# Patient Record
Sex: Female | Born: 1970 | Race: Black or African American | Hispanic: No | Marital: Single | Smoking: Current every day smoker
Health system: Southern US, Community
[De-identification: ages and names within clinical notes are randomized; demographics above are authoritative.]

## PROBLEM LIST (undated history)

## (undated) DIAGNOSIS — F419 Anxiety disorder, unspecified: Secondary | ICD-10-CM

## (undated) DIAGNOSIS — R609 Edema, unspecified: Secondary | ICD-10-CM

## (undated) DIAGNOSIS — M1990 Unspecified osteoarthritis, unspecified site: Secondary | ICD-10-CM

## (undated) DIAGNOSIS — K589 Irritable bowel syndrome without diarrhea: Secondary | ICD-10-CM

## (undated) DIAGNOSIS — J302 Other seasonal allergic rhinitis: Secondary | ICD-10-CM

## (undated) DIAGNOSIS — I10 Essential (primary) hypertension: Secondary | ICD-10-CM

## (undated) HISTORY — DX: Other seasonal allergic rhinitis: J30.2

## (undated) HISTORY — DX: Irritable bowel syndrome, unspecified: K58.9

## (undated) HISTORY — PX: TONSILLECTOMY: SUR1361

## (undated) HISTORY — PX: TONSILLECTOMY AND ADENOIDECTOMY: SHX28

## (undated) HISTORY — PX: TUBAL LIGATION: SHX77

## (undated) HISTORY — DX: Edema, unspecified: R60.9

---

## 2000-06-16 ENCOUNTER — Emergency Department (HOSPITAL_COMMUNITY): Admission: EM | Admit: 2000-06-16 | Discharge: 2000-06-17 | Payer: Self-pay | Admitting: Emergency Medicine

## 2001-03-13 ENCOUNTER — Other Ambulatory Visit: Admission: RE | Admit: 2001-03-13 | Discharge: 2001-03-13 | Payer: Self-pay | Admitting: *Deleted

## 2002-01-22 ENCOUNTER — Encounter: Payer: Self-pay | Admitting: Family Medicine

## 2002-01-22 ENCOUNTER — Encounter: Admission: RE | Admit: 2002-01-22 | Discharge: 2002-01-22 | Payer: Self-pay | Admitting: Family Medicine

## 2003-09-07 ENCOUNTER — Encounter: Admission: RE | Admit: 2003-09-07 | Discharge: 2003-09-07 | Payer: Self-pay | Admitting: Family Medicine

## 2003-09-07 ENCOUNTER — Other Ambulatory Visit: Admission: RE | Admit: 2003-09-07 | Discharge: 2003-09-07 | Payer: Self-pay | Admitting: Obstetrics and Gynecology

## 2004-09-11 ENCOUNTER — Other Ambulatory Visit: Admission: RE | Admit: 2004-09-11 | Discharge: 2004-09-11 | Payer: Self-pay | Admitting: Obstetrics and Gynecology

## 2004-09-12 ENCOUNTER — Other Ambulatory Visit: Admission: RE | Admit: 2004-09-12 | Discharge: 2004-09-12 | Payer: Self-pay | Admitting: Obstetrics and Gynecology

## 2006-08-12 ENCOUNTER — Encounter: Admission: RE | Admit: 2006-08-12 | Discharge: 2006-08-12 | Payer: Self-pay | Admitting: Obstetrics & Gynecology

## 2007-03-30 ENCOUNTER — Emergency Department (HOSPITAL_COMMUNITY): Admission: EM | Admit: 2007-03-30 | Discharge: 2007-03-30 | Payer: Self-pay | Admitting: Emergency Medicine

## 2007-11-03 ENCOUNTER — Emergency Department (HOSPITAL_COMMUNITY): Admission: EM | Admit: 2007-11-03 | Discharge: 2007-11-04 | Payer: Self-pay | Admitting: Emergency Medicine

## 2007-12-09 ENCOUNTER — Inpatient Hospital Stay (HOSPITAL_COMMUNITY): Admission: AD | Admit: 2007-12-09 | Discharge: 2007-12-10 | Payer: Self-pay | Admitting: Family Medicine

## 2008-08-05 ENCOUNTER — Inpatient Hospital Stay (HOSPITAL_COMMUNITY): Admission: AD | Admit: 2008-08-05 | Discharge: 2008-08-05 | Payer: Self-pay | Admitting: Obstetrics and Gynecology

## 2008-08-17 ENCOUNTER — Emergency Department (HOSPITAL_COMMUNITY): Admission: EM | Admit: 2008-08-17 | Discharge: 2008-08-18 | Payer: Self-pay | Admitting: Emergency Medicine

## 2008-10-09 ENCOUNTER — Emergency Department (HOSPITAL_COMMUNITY): Admission: EM | Admit: 2008-10-09 | Discharge: 2008-10-09 | Payer: Self-pay | Admitting: Family Medicine

## 2009-06-23 ENCOUNTER — Emergency Department (HOSPITAL_COMMUNITY): Admission: EM | Admit: 2009-06-23 | Discharge: 2009-06-23 | Payer: Self-pay | Admitting: Family Medicine

## 2011-05-14 LAB — URINALYSIS, ROUTINE W REFLEX MICROSCOPIC
Bilirubin Urine: NEGATIVE
Glucose, UA: NEGATIVE
Specific Gravity, Urine: 1.007
pH: 6

## 2011-05-14 LAB — URINE CULTURE

## 2011-05-14 LAB — URINE MICROSCOPIC-ADD ON

## 2011-05-15 LAB — WET PREP, GENITAL
Trich, Wet Prep: NONE SEEN
Yeast Wet Prep HPF POC: NONE SEEN

## 2011-05-25 LAB — WET PREP, GENITAL: Trich, Wet Prep: NONE SEEN

## 2011-05-25 LAB — GC/CHLAMYDIA PROBE AMP, GENITAL: GC Probe Amp, Genital: NEGATIVE

## 2011-05-25 LAB — HERPES SIMPLEX VIRUS CULTURE

## 2013-05-07 ENCOUNTER — Ambulatory Visit: Payer: Self-pay | Admitting: Gynecology

## 2013-05-14 ENCOUNTER — Other Ambulatory Visit (HOSPITAL_COMMUNITY)
Admission: RE | Admit: 2013-05-14 | Discharge: 2013-05-14 | Disposition: A | Discharge status: Home / Self Care | Payer: Managed Care, Other (non HMO) | Source: Ambulatory Visit | Attending: Gynecology | Admitting: Gynecology

## 2013-05-14 ENCOUNTER — Encounter: Payer: Self-pay | Admitting: Gynecology

## 2013-05-14 ENCOUNTER — Ambulatory Visit (INDEPENDENT_AMBULATORY_CARE_PROVIDER_SITE_OTHER): Payer: Managed Care, Other (non HMO) | Admitting: Gynecology

## 2013-05-14 VITALS — BP 134/86 | Ht 66.25 in | Wt 203.4 lb

## 2013-05-14 DIAGNOSIS — R87810 Cervical high risk human papillomavirus (HPV) DNA test positive: Secondary | ICD-10-CM | POA: Insufficient documentation

## 2013-05-14 DIAGNOSIS — R635 Abnormal weight gain: Secondary | ICD-10-CM

## 2013-05-14 DIAGNOSIS — Z1151 Encounter for screening for human papillomavirus (HPV): Secondary | ICD-10-CM | POA: Insufficient documentation

## 2013-05-14 DIAGNOSIS — Z01419 Encounter for gynecological examination (general) (routine) without abnormal findings: Secondary | ICD-10-CM

## 2013-05-14 DIAGNOSIS — N898 Other specified noninflammatory disorders of vagina: Secondary | ICD-10-CM

## 2013-05-14 DIAGNOSIS — Z113 Encounter for screening for infections with a predominantly sexual mode of transmission: Secondary | ICD-10-CM

## 2013-05-14 DIAGNOSIS — F17200 Nicotine dependence, unspecified, uncomplicated: Secondary | ICD-10-CM

## 2013-05-14 LAB — CBC WITH DIFFERENTIAL/PLATELET
Basophils Relative: 1 % (ref 0–1)
HCT: 42.7 % (ref 36.0–46.0)
Hemoglobin: 15.3 g/dL — ABNORMAL HIGH (ref 12.0–15.0)
Lymphs Abs: 1.4 10*3/uL (ref 0.7–4.0)
MCHC: 35.8 g/dL (ref 30.0–36.0)
Monocytes Absolute: 0.5 10*3/uL (ref 0.1–1.0)
Monocytes Relative: 9 % (ref 3–12)
Neutro Abs: 3.6 10*3/uL (ref 1.7–7.7)
RBC: 4.5 MIL/uL (ref 3.87–5.11)

## 2013-05-14 LAB — COMPREHENSIVE METABOLIC PANEL
Albumin: 4.2 g/dL (ref 3.5–5.2)
BUN: 9 mg/dL (ref 6–23)
CO2: 28 mEq/L (ref 19–32)
Calcium: 9.6 mg/dL (ref 8.4–10.5)
Chloride: 102 mEq/L (ref 96–112)
Glucose, Bld: 95 mg/dL (ref 70–99)
Potassium: 3.3 mEq/L — ABNORMAL LOW (ref 3.5–5.3)
Sodium: 136 mEq/L (ref 135–145)
Total Protein: 7 g/dL (ref 6.0–8.3)

## 2013-05-14 LAB — CHOLESTEROL, TOTAL: Cholesterol: 191 mg/dL (ref 0–200)

## 2013-05-14 LAB — HEMOGLOBIN A1C: Mean Plasma Glucose: 103 mg/dL (ref <117)

## 2013-05-14 LAB — WET PREP FOR TRICH, YEAST, CLUE
Trich, Wet Prep: NONE SEEN
Yeast Wet Prep HPF POC: NONE SEEN

## 2013-05-14 MED ORDER — TINIDAZOLE 500 MG PO TABS: ORAL_TABLET | ORAL | Status: DC

## 2013-05-14 NOTE — Patient Instructions (Addendum)
Smoking Cessation Quitting smoking is important to your health and has many advantages. However, it is not always easy to quit since nicotine is a very addictive drug. Often times, people try 3 times or more before being able to quit. This document explains the best ways for you to prepare to quit smoking. Quitting takes hard work and a lot of effort, but you can do it. ADVANTAGES OF QUITTING SMOKING  You will live longer, feel better, and live better.  Your body will feel the impact of quitting smoking almost immediately.  Within 20 minutes, blood pressure decreases. Your pulse returns to its normal level.  After 8 hours, carbon monoxide levels in the blood return to normal. Your oxygen level increases.  After 24 hours, the chance of having a heart attack starts to decrease. Your breath, hair, and body stop smelling like smoke.  After 48 hours, damaged nerve endings begin to recover. Your sense of taste and smell improve.  After 72 hours, the body is virtually free of nicotine. Your bronchial tubes relax and breathing becomes easier.  After 2 to 12 weeks, lungs can hold more air. Exercise becomes easier and circulation improves.  The risk of having a heart attack, stroke, cancer, or lung disease is greatly reduced.  After 1 year, the risk of coronary heart disease is cut in half.  After 5 years, the risk of stroke falls to the same as a nonsmoker.  After 10 years, the risk of lung cancer is cut in half and the risk of other cancers decreases significantly.  After 15 years, the risk of coronary heart disease drops, usually to the level of a nonsmoker.  If you are pregnant, quitting smoking will improve your chances of having a healthy baby.  The people you live with, especially any children, will be healthier.  You will have extra money to spend on things other than cigarettes. QUESTIONS TO THINK ABOUT BEFORE ATTEMPTING TO QUIT You may want to talk about your answers with your  caregiver.  Why do you want to quit?  If you tried to quit in the past, what helped and what did not?  What will be the most difficult situations for you after you quit? How will you plan to handle them?  Who can help you through the tough times? Your family? Friends? A caregiver?  What pleasures do you get from smoking? What ways can you still get pleasure if you quit? Here are some questions to ask your caregiver:  How can you help me to be successful at quitting?  What medicine do you think would be best for me and how should I take it?  What should I do if I need more help?  What is smoking withdrawal like? How can I get information on withdrawal? GET READY  Set a quit date.  Change your environment by getting rid of all cigarettes, ashtrays, matches, and lighters in your home, car, or work. Do not let people smoke in your home.  Review your past attempts to quit. Think about what worked and what did not. GET SUPPORT AND ENCOURAGEMENT You have a better chance of being successful if you have help. You can get support in many ways.  Tell your family, friends, and co-workers that you are going to quit and need their support. Ask them not to smoke around you.  Get individual, group, or telephone counseling and support. Programs are available at local hospitals and health centers. Call your local health department for   information about programs in your area.  Spiritual beliefs and practices may help some smokers quit.  Download a "quit meter" on your computer to keep track of quit statistics, such as how long you have gone without smoking, cigarettes not smoked, and money saved.  Get a self-help book about quitting smoking and staying off of tobacco. LEARN NEW SKILLS AND BEHAVIORS  Distract yourself from urges to smoke. Talk to someone, go for a walk, or occupy your time with a task.  Change your normal routine. Take a different route to work. Drink tea instead of coffee.  Eat breakfast in a different place.  Reduce your stress. Take a hot bath, exercise, or read a book.  Plan something enjoyable to do every day. Reward yourself for not smoking.  Explore interactive web-based programs that specialize in helping you quit. GET MEDICINE AND USE IT CORRECTLY Medicines can help you stop smoking and decrease the urge to smoke. Combining medicine with the above behavioral methods and support can greatly increase your chances of successfully quitting smoking.  Nicotine replacement therapy helps deliver nicotine to your body without the negative effects and risks of smoking. Nicotine replacement therapy includes nicotine gum, lozenges, inhalers, nasal sprays, and skin patches. Some may be available over-the-counter and others require a prescription.  Antidepressant medicine helps people abstain from smoking, but how this works is unknown. This medicine is available by prescription.  Nicotinic receptor partial agonist medicine simulates the effect of nicotine in your brain. This medicine is available by prescription. Ask your caregiver for advice about which medicines to use and how to use them based on your health history. Your caregiver will tell you what side effects to look out for if you choose to be on a medicine or therapy. Carefully read the information on the package. Do not use any other product containing nicotine while using a nicotine replacement product.  RELAPSE OR DIFFICULT SITUATIONS Most relapses occur within the first 3 months after quitting. Do not be discouraged if you start smoking again. Remember, most people try several times before finally quitting. You may have symptoms of withdrawal because your body is used to nicotine. You may crave cigarettes, be irritable, feel very hungry, cough often, get headaches, or have difficulty concentrating. The withdrawal symptoms are only temporary. They are strongest when you first quit, but they will go away within  10 14 days. To reduce the chances of relapse, try to:  Avoid drinking alcohol. Drinking lowers your chances of successfully quitting.  Reduce the amount of caffeine you consume. Once you quit smoking, the amount of caffeine in your body increases and can give you symptoms, such as a rapid heartbeat, sweating, and anxiety.  Avoid smokers because they can make you want to smoke.  Do not let weight gain distract you. Many smokers will gain weight when they quit, usually less than 10 pounds. Eat a healthy diet and stay active. You can always lose the weight gained after you quit.  Find ways to improve your mood other than smoking. FOR MORE INFORMATION  www.smokefree.gov  Document Released: 07/31/2001 Document Revised: 02/05/2012 Document Reviewed: 11/15/2011 [facility] Patient Information 2014 [place], [place].  Tetanus, Diphtheria, Pertussis (Tdap) Vaccine What You Need to Know WHY GET VACCINATED? Tetanus, diphtheria and pertussis can be very serious diseases, even for adolescents and adults. Tdap vaccine can protect [place] from these diseases. TETANUS (Lockjaw) causes painful muscle tightening and stiffness, usually all over the body.  It can lead to tightening of muscles in  the head and neck so you can't open your mouth, swallow, or sometimes even breathe. Tetanus kills about 1 out of 5 people who are infected. DIPHTHERIA can cause a thick coating to form in the back of the throat.  It can lead to breathing problems, paralysis, heart failure, and death. PERTUSSIS (Whooping Cough) causes severe coughing spells, which can cause difficulty breathing, vomiting and disturbed sleep.  It can also lead to weight loss, incontinence, and rib fractures. Up to 2 in 100 adolescents and 5 in 100 adults with pertussis are hospitalized or have complications, which could include pneumonia and death. These diseases are caused by bacteria. Diphtheria and pertussis are spread from person to person through coughing  or sneezing. Tetanus enters the body through cuts, scratches, or wounds. Before vaccines, the [place] saw as many as 200,000 cases a year of diphtheria and pertussis, and hundreds of cases of tetanus. Since vaccination began, tetanus and diphtheria have dropped by about 99% and pertussis by about 80%. TDAP VACCINE Tdap vaccine can protect adolescents and adults from tetanus, diphtheria, and pertussis. One dose of Tdap is routinely given at age 70 or 10. People who did not get Tdap at that age should get it as soon as possible. Tdap is especially important for health care professionals and anyone having close contact with a baby younger than 12 months. Pregnant women should get a dose of Tdap during every pregnancy, to protect the newborn from pertussis. Infants are most at risk for severe, life-threatening complications from pertussis. A similar vaccine, called Td, protects from tetanus and diphtheria, but not pertussis. A Td booster should be given every 10 years. Tdap may be given as one of these boosters if you have not already gotten a dose. Tdap may also be given after a severe cut or burn to prevent tetanus infection. Your doctor can give you more information. Tdap may safely be given at the same time as other vaccines. SOME PEOPLE SHOULD NOT GET THIS VACCINE  If you ever had a life-threatening allergic reaction after a dose of any tetanus, diphtheria, or pertussis containing vaccine, OR if you have a severe allergy to any part of this vaccine, you should not get Tdap. Tell your doctor if you have any severe allergies.  If you had a coma, or long or multiple seizures within 7 days after a childhood dose of DTP or DTaP, you should not get Tdap, unless a cause other than the vaccine was found. You can still get Td.  Talk to your doctor if you:  have epilepsy or another nervous system problem,  had severe pain or swelling after any vaccine containing diphtheria, tetanus or  pertussis,  ever had Guillain-Barr Syndrome (GBS),  aren't feeling well on the day the shot is scheduled. RISKS OF A VACCINE REACTION With any medicine, including vaccines, there is a chance of side effects. These are usually mild and go away on their own, but serious reactions are also possible. Brief fainting spells can follow a vaccination, leading to injuries from falling. Sitting or lying down for about 15 minutes can help prevent these. Tell your doctor if you feel dizzy or light-headed, or have vision changes or ringing in the ears. Mild problems following Tdap (Did not interfere with activities)  Pain where the shot was given (about 3 in 4 adolescents or 2 in 3 adults)  Redness or swelling where the shot was given (about 1 person in 5)  Mild fever of at least 100.30F (up  to about 1 in 25 adolescents or 1 in 100 adults)  Headache (about 3 or 4 people in 10)  Tiredness (about 1 person in 3 or 4)  Nausea, vomiting, diarrhea, stomach ache (up to 1 in 4 adolescents or 1 in 10 adults)  Chills, body aches, sore joints, rash, swollen glands (uncommon) Moderate problems following Tdap (Interfered with activities, but did not require medical attention)  Pain where the shot was given (about 1 in 5 adolescents or 1 in 100 adults)  Redness or swelling where the shot was given (up to about 1 in 16 adolescents or 1 in 25 adults)  Fever over 102F (about 1 in 100 adolescents or 1 in 250 adults)  Headache (about 3 in 20 adolescents or 1 in 10 adults)  Nausea, vomiting, diarrhea, stomach ache (up to 1 or 3 people in 100)  Swelling of the entire arm where the shot was given (up to about 3 in 100). Severe problems following Tdap (Unable to perform usual activities, required medical attention)  Swelling, severe pain, bleeding and redness in the arm where the shot was given (rare). A severe allergic reaction could occur after any vaccine (estimated less than 1 in a million doses). WHAT IF  THERE IS A SERIOUS REACTION? What should I look for?  Look for anything that concerns you, such as signs of a severe allergic reaction, very high fever, or behavior changes. Signs of a severe allergic reaction can include hives, swelling of the face and throat, difficulty breathing, a fast heartbeat, dizziness, and weakness. These would start a few minutes to a few hours after the vaccination. What should I do?  If you think it is a severe allergic reaction or other emergency that can't wait, call 9-1-1 or get the person to the nearest hospital. Otherwise, call your doctor.  Afterward, the reaction should be reported to the "Vaccine Adverse Event Reporting System" (VAERS). Your doctor might file this report, or you can do it yourself through the VAERS web site at www.vaers.LAgents.no, or by calling 1-419-194-6243. VAERS is only for reporting reactions. They do not give medical advice.  THE NATIONAL VACCINE INJURY COMPENSATION PROGRAM The National Vaccine Injury Compensation Program (VICP) is a federal program that was created to compensate people who may have been injured by certain vaccines. Persons who believe they may have been injured by a vaccine can learn about the program and about filing a claim by calling 1-(418) 749-1733 or visiting the VICP website at SpiritualWord.at. HOW CAN I LEARN MORE?  Ask your doctor.  Call your local or state health department.  Contact the Centers for Disease Control and Prevention (CDC):  Call 952-118-8864 or visit CDC's website at PicCapture.uy. CDC Tdap Vaccine VIS (12/27/11) Document Released: 02/05/2012 Document Revised: 04/30/2012 Document Reviewed: 02/05/2012 [employer] Patient Information 2014 [place], [place].  Varenicline oral tablets What is this medicine? VARENICLINE (var EN i kleen) is used to help people quit smoking. It can reduce the symptoms caused by stopping smoking. It is used with a patient support program  recommended by your physician. This medicine may be used for other purposes; ask your health care provider or pharmacist if you have questions. What should I tell my health care provider before I take this medicine? They need to know if you have any of these conditions: -bipolar disorder, depression, schizophrenia or other mental illness -heart disease -kidney disease -peripheral vascular disease -stroke -suicidal thoughts, plans, or attempt; a previous suicide attempt by you or a family member -an unusual  or allergic reaction to varenicline, other medicines, foods, dyes, or preservatives -pregnant or trying to get pregnant -breast-feeding How should I use this medicine? You should set a date to stop smoking and tell your doctor. Start this medicine one week before the quit date. You can also start taking this medicine before you choose a quit date, and then pick a quit date that is between 8 and 35 days of treatment with this medicine. Stick to your plan; ask about support groups or other ways to help you remain a 'quitter'. Take this medicine by mouth after eating. Take with a full glass of water. Follow the directions on the prescription label. Take your doses at regular intervals. Do not take your medicine more often than directed. A special MedGuide will be given to you by the pharmacist with each prescription and refill. Be sure to read this information carefully each time. Talk to your pediatrician regarding the use of this medicine in children. This medicine is not approved for use in children. Overdosage: If you think you have taken too much of this medicine contact a poison control center or emergency room at once. NOTE: This medicine is only for you. Do not share this medicine with others. What if I miss a dose? If you miss a dose, take it as soon as you can. If it is almost time for your next dose, take only that dose. Do not take double or extra doses. What may interact with this  medicine? -insulin -other stop smoking aids -theophylline -warfarin This list may not describe all possible interactions. Give your health care provider a list of all the medicines, herbs, non-prescription drugs, or dietary supplements you use. Also tell them if you smoke, drink alcohol, or use illegal drugs. Some items may interact with your medicine. What should I watch for while using this medicine? Visit your doctor or health care professional for regular check ups. Ask for ongoing advice and encouragement from your doctor or healthcare professional, friends, and family to help you quit. If you smoke while on this medication, quit again Your mouth may get dry. Chewing sugarless gum or sucking hard candy, and drinking plenty of water may help. Contact your doctor if the problem does not go away or is severe. You may get drowsy or dizzy. Do not drive, use machinery, or do anything that needs mental alertness until you know how this medicine affects you. Do not stand or sit up quickly, especially if you are an older patient. This reduces the risk of dizzy or fainting spells. The use of this medicine may increase the chance of suicidal thoughts or actions. Pay special attention to how you are responding while on this medicine. Any worsening of mood, or thoughts of suicide or dying should be reported to your health care professional right away. What side effects may I notice from receiving this medicine? Side effects that you should report to your doctor or health care professional as soon as possible: -allergic reactions like skin rash, itching or hives, swelling of the face, lips, tongue, or throat -breathing problems -changes in vision -chest pain or chest tightness -confusion, trouble speaking or understanding -fast, irregular heartbeat -feeling faint or lightheaded, falls -fever -pain in legs when walking -problems with balance, talking, walking -ringing in ears -sudden numbness or  weakness of the face, arm or leg -suicidal thoughts or other mood changes -trouble passing urine or change in the amount of urine -unusual bleeding or bruising -unusually weak or tired Side  effects that usually do not require medical attention (report to your doctor or health care professional if they continue or are bothersome): -constipation -headache -nausea, vomiting -strange dreams -stomach gas -trouble sleeping This list may not describe all possible side effects. Call your doctor for medical advice about side effects. You may report side effects to FDA at 1-800-FDA-1088. Where should I keep my medicine? Keep out of the reach of children. Store at room temperature between 15 and 30 degrees C (59 and 86 degrees F). Throw away any unused medicine after the expiration date. NOTE: This sheet is a summary. It may not cover all possible information. If you have questions about this medicine, talk to your doctor, pharmacist, or health care provider.  2013, Elsevier/Gold Standard. (03/13/2010 3:12:38 PM)   Bacterial Vaginosis Bacterial vaginosis (BV) is a vaginal infection where the normal balance of bacteria in the vagina is disrupted. The normal balance is then replaced by an overgrowth of certain bacteria. There are several different kinds of bacteria that can cause BV. BV is the most common vaginal infection in women of childbearing age. CAUSES   The cause of BV is not fully understood. BV develops when there is an increase or imbalance of harmful bacteria.  Some activities or behaviors can upset the normal balance of bacteria in the vagina and put women at increased risk including:  Having a new sex partner or multiple sex partners.  Douching.  Using an intrauterine device (IUD) for contraception.  It is not clear what role sexual activity plays in the development of BV. However, women that have never had sexual intercourse are rarely infected with BV. Women do not get BV from  toilet seats, bedding, swimming pools or from touching objects around them.  SYMPTOMS   Grey vaginal discharge.  A fish-like odor with discharge, especially after sexual intercourse.  Itching or burning of the vagina and vulva.  Burning or pain with urination.  Some women have no signs or symptoms at all. DIAGNOSIS  Your caregiver must examine the vagina for signs of BV. Your caregiver will perform lab tests and look at the sample of vaginal fluid through a microscope. They will look for bacteria and abnormal cells (clue cells), a pH test higher than 4.5, and a positive amine test all associated with BV.  RISKS AND COMPLICATIONS   Pelvic inflammatory disease (PID).  Infections following gynecology surgery.  Developing HIV.  Developing herpes virus. TREATMENT  Sometimes BV will clear up without treatment. However, all women with symptoms of BV should be treated to avoid complications, especially if gynecology surgery is planned. Female partners generally do not need to be treated. However, BV may spread between female sex partners so treatment is helpful in preventing a recurrence of BV.   BV may be treated with antibiotics. The antibiotics come in either pill or vaginal cream forms. Either can be used with nonpregnant or pregnant women, but the recommended dosages differ. These antibiotics are not harmful to the baby.  BV can recur after treatment. If this happens, a second round of antibiotics will often be prescribed.  Treatment is important for pregnant women. If not treated, BV can cause a premature delivery, especially for a pregnant woman who had a premature birth in the past. All pregnant women who have symptoms of BV should be checked and treated.  For chronic reoccurrence of BV, treatment with a type of prescribed gel vaginally twice a week is helpful. HOME CARE INSTRUCTIONS   Finish all medication  as directed by your caregiver.  Do not have sex until treatment is  completed.  Tell your sexual partner that you have a vaginal infection. They should see their caregiver and be treated if they have problems, such as a mild rash or itching.  Practice safe sex. Use condoms. Only have 1 sex partner. PREVENTION  Basic prevention steps can help reduce the risk of upsetting the natural balance of bacteria in the vagina and developing BV:  Do not have sexual intercourse (be abstinent).  Do not douche.  Use all of the medicine prescribed for treatment of BV, even if the signs and symptoms go away.  Tell your sex partner if you have BV. That way, they can be treated, if needed, to prevent reoccurrence. SEEK MEDICAL CARE IF:   Your symptoms are not improving after 3 days of treatment.  You have increased discharge, pain, or fever. MAKE SURE YOU:   Understand these instructions.  Will watch your condition.  Will get help right away if you are not doing well or get worse. FOR MORE INFORMATION  Division of STD Prevention (DSTDP), Centers for Disease Control and Prevention: SolutionApps.co.za American Social Health Association (ASHA): www.ashastd.org  Document Released: 08/06/2005 Document Revised: 10/29/2011 Document Reviewed: 01/27/2009 [facility] Patient Information 2014 [place], [place].

## 2013-05-14 NOTE — Addendum Note (Signed)
Addended by: Bertram Savin A on: 05/14/2013 10:24 AM   Modules accepted: Orders

## 2013-05-14 NOTE — Progress Notes (Signed)
Ashley Bailey December 15, 1970 [id number]   History:    42 y.o.  for annual gyn exam New patient to the practice. Patient stated her gynecological examination in another medical facility was over 2 years ago. Patient denies any prior history of abnormal Pap smears. Patient has been complaining of several weeks of vaginal discharge slightly clear but no pruritus. Patient has not been sexually active in over 7 months. Patient reports normal menstrual cycles. Patient is a gravida 3 para 2 (2 cesarean sections/BTSP) Ab1 (elective termination). The patient smokes half a pack of cigarettes per day. Patient with strong family history of diabetes were as her sister has type 1 diabetes. Patient also had been complaining of weight gain. Patient declined flu vaccine. Patient uncertain of Tdap vaccine status. Last mammogram 2007 (dense).  Past medical history,surgical history, family history and social history were all reviewed and documented in the EPIC chart.  Gynecologic History Patient's last menstrual period was 05/04/2013. Contraception: tubal ligation Last Pap: 2007. Results were: normal Last mammogram: 2007. Results were: normal  Obstetric History OB History  Gravida Para Term Preterm AB SAB TAB Ectopic Multiple Living  3 2   1     2     # Outcome Date GA Lbr Len/2nd Weight Sex Delivery Anes PTL Lv  3 ABT           2 PAR           1 PAR                ROS: A ROS was performed and pertinent positives and negatives are included in the history.  GENERAL: No fevers or chills. HEENT: No change in vision, no earache, sore throat or sinus congestion. NECK: No pain or stiffness. CARDIOVASCULAR: No chest pain or pressure. No palpitations. PULMONARY: No shortness of breath, cough or wheeze. GASTROINTESTINAL: No abdominal pain, nausea, vomiting or diarrhea, melena or bright red blood per rectum. GENITOURINARY: No urinary frequency, urgency, hesitancy or dysuria. MUSCULOSKELETAL: No joint or muscle pain, no  back pain, no recent trauma. DERMATOLOGIC: No rash, no itching, no lesions. ENDOCRINE: No polyuria, polydipsia, no heat or cold intolerance. No recent change in weight. HEMATOLOGICAL: No anemia or easy bruising or bleeding. NEUROLOGIC: No headache, seizures, numbness, tingling or weakness. PSYCHIATRIC: No depression, no loss of interest in normal activity or change in sleep pattern.    Complaining of clear discharge with fascia odor  Exam: chaperone present  BP 134/86  Ht 5' 6.25" (1.683 m)  Wt 203 lb 6.4 oz (92.262 kg)  BMI 32.57 kg/m2  LMP 05/04/2013  Body mass index is 32.57 kg/(m^2).  General appearance : Well developed well nourished female. No acute distress HEENT: Neck supple, trachea midline, no carotid bruits, no thyroidmegaly Lungs: Clear to auscultation, no rhonchi or wheezes, or rib retractions  Heart: Regular rate and rhythm, no murmurs or gallops Breast:Examined in sitting and supine position were symmetrical in appearance, no palpable masses or tenderness,  no skin retraction, no nipple inversion, no nipple discharge, no skin discoloration, no axillary or supraclavicular lymphadenopathy Abdomen: no palpable masses or tenderness, no rebound or guarding Extremities: no edema or skin discoloration or tenderness  Pelvic:  Bartholin, Urethra, Skene Glands: Within normal limits             Vagina: No gross lesions or discharge, clear white discharge fascia where  Cervix: No gross lesions or discharge  Uterus  anteverted, normal size, shape and consistency, non-tender and mobile  Adnexa  Without masses  or tenderness  Anus and perineum  normal   Rectovaginal  normal sphincter tone without palpated masses or tenderness             Hemoccult none indicated   Wet prep: Pos amine, moderate clue cells, few white blood cell, too numerous to count bacteria  Assessment/Plan:  42 y.o. female for annual exam with clinical evidence of bacterial vaginosis. Patient will be started on  Tindamax 2 g today and repeat 2 g in 24 hours.. GC and chlamydia culture pending. Patient was counseled on the detrimental effects of smoking. Literature and information was provided as well as on Chantix. Patient declined flu vaccine. She will check with her primary physician if she has received her Tdap vaccine. Pap smear done today. Lab test done today as follows: CBC, screen cholesterol, TSH, hemoglobin A1c, comprehensive metabolic panel, and urinalysis. Patient was reminded to do her monthly self breast examination. We discussed the utilization of probiotic gel to minimize recurrence of BV or yeast infections. Patient was provided with requisition to schedule her mammogram which was overdue.    Ok Edwards MD, 10:16 AM 05/14/2013

## 2013-05-15 ENCOUNTER — Other Ambulatory Visit: Payer: Self-pay | Admitting: *Deleted

## 2013-05-15 DIAGNOSIS — E876 Hypokalemia: Secondary | ICD-10-CM

## 2013-05-15 LAB — URINALYSIS W MICROSCOPIC + REFLEX CULTURE
Bacteria, UA: NONE SEEN
Casts: NONE SEEN
Hgb urine dipstick: NEGATIVE
Ketones, ur: NEGATIVE mg/dL
Leukocytes, UA: NEGATIVE
Nitrite: NEGATIVE
Protein, ur: NEGATIVE mg/dL

## 2013-05-15 LAB — GC/CHLAMYDIA PROBE AMP
CT Probe RNA: NEGATIVE
GC Probe RNA: NEGATIVE

## 2013-05-15 LAB — TSH: TSH: 1.635 u[IU]/mL (ref 0.350–4.500)

## 2013-05-28 ENCOUNTER — Other Ambulatory Visit: Payer: Managed Care, Other (non HMO)

## 2013-06-24 ENCOUNTER — Telehealth: Payer: Self-pay | Admitting: *Deleted

## 2013-06-24 MED ORDER — TINIDAZOLE 500 MG PO TABS: ORAL_TABLET | ORAL | Status: DC

## 2013-06-24 NOTE — Telephone Encounter (Signed)
Sent rx to pharmacy, left the below on pt voicemail.

## 2013-06-24 NOTE — Telephone Encounter (Signed)
Please call and prescription refill if symptoms continue she needs to come to the office for culture

## 2013-06-24 NOTE — Telephone Encounter (Signed)
Pt calling requesting refill on Tindamax given on 05/14/13, pt said symptoms have returned clear discharge. OV declined for now. Please advise

## 2013-06-25 ENCOUNTER — Other Ambulatory Visit: Payer: Self-pay

## 2013-11-27 ENCOUNTER — Other Ambulatory Visit: Payer: Self-pay

## 2013-11-27 ENCOUNTER — Telehealth: Payer: Self-pay | Admitting: *Deleted

## 2013-11-27 DIAGNOSIS — Z1231 Encounter for screening mammogram for malignant neoplasm of breast: Secondary | ICD-10-CM

## 2013-11-27 NOTE — Telephone Encounter (Signed)
Pt called requesting name of mammogram facility, name & # of breast center given to patient.

## 2013-11-30 ENCOUNTER — Ambulatory Visit: Payer: Managed Care, Other (non HMO)

## 2013-12-07 ENCOUNTER — Ambulatory Visit: Payer: Managed Care, Other (non HMO)

## 2013-12-07 ENCOUNTER — Ambulatory Visit
Admission: RE | Admit: 2013-12-07 | Discharge: 2013-12-07 | Disposition: A | Discharge status: Home / Self Care | Payer: Managed Care, Other (non HMO) | Source: Ambulatory Visit

## 2013-12-07 DIAGNOSIS — Z1231 Encounter for screening mammogram for malignant neoplasm of breast: Secondary | ICD-10-CM

## 2013-12-11 ENCOUNTER — Ambulatory Visit: Payer: Managed Care, Other (non HMO)

## 2014-06-10 ENCOUNTER — Other Ambulatory Visit: Payer: Self-pay

## 2014-06-10 ENCOUNTER — Ambulatory Visit
Admission: RE | Admit: 2014-06-10 | Discharge: 2014-06-10 | Disposition: A | Discharge status: Home / Self Care | Payer: 59 | Source: Ambulatory Visit

## 2014-06-10 ENCOUNTER — Ambulatory Visit: Admission: RE | Admit: 2014-06-10 | Payer: 59 | Source: Ambulatory Visit

## 2014-06-10 DIAGNOSIS — Z1231 Encounter for screening mammogram for malignant neoplasm of breast: Secondary | ICD-10-CM

## 2014-06-21 ENCOUNTER — Encounter: Payer: Self-pay | Admitting: Gynecology

## 2014-07-06 ENCOUNTER — Ambulatory Visit (INDEPENDENT_AMBULATORY_CARE_PROVIDER_SITE_OTHER): Payer: Managed Care, Other (non HMO) | Admitting: Women's Health

## 2014-07-06 ENCOUNTER — Encounter: Payer: Self-pay | Admitting: Women's Health

## 2014-07-06 VITALS — BP 140/80 | Ht 66.0 in | Wt 204.0 lb

## 2014-07-06 DIAGNOSIS — L700 Acne vulgaris: Secondary | ICD-10-CM

## 2014-07-06 DIAGNOSIS — B9689 Other specified bacterial agents as the cause of diseases classified elsewhere: Secondary | ICD-10-CM

## 2014-07-06 DIAGNOSIS — A499 Bacterial infection, unspecified: Secondary | ICD-10-CM

## 2014-07-06 DIAGNOSIS — N898 Other specified noninflammatory disorders of vagina: Secondary | ICD-10-CM

## 2014-07-06 DIAGNOSIS — N760 Acute vaginitis: Secondary | ICD-10-CM

## 2014-07-06 LAB — WET PREP FOR TRICH, YEAST, CLUE
Trich, Wet Prep: NONE SEEN
WBC, Wet Prep HPF POC: NONE SEEN
YEAST WET PREP: NONE SEEN

## 2014-07-06 MED ORDER — METRONIDAZOLE 0.75 % VA GEL: VAGINAL | Status: DC

## 2014-07-06 MED ORDER — TINIDAZOLE 500 MG PO TABS: ORAL_TABLET | ORAL | Status: DC

## 2014-07-06 MED ORDER — DOXYCYCLINE HYCLATE 100 MG PO CAPS: ORAL_CAPSULE | ORAL | Status: DC

## 2014-07-06 NOTE — Progress Notes (Signed)
Patient ID: Ashley Bailey, female   DOB: [birth year], 43 y.o.   MRN: [id number] Presents with complaint of vaginal discharge with odor. Has had problems with recurrent BV no relief with  boric acid. Reports best relief with Tindamax. Same partner, monthly cycle/BTL. Denies urinary symptoms, abdominal pain or fever.  Exam: Appears well. External genitalia within normal limits, speculum exam scant brown discharge and menses, scant odor, wet prep positive for amines, clues, TNTC bacteria. Bimanual no CMT or adnexal tenderness.  Bacteria vaginosis  Plan: Tindamax 2 g by mouth 1 dose, MetroGel one half to one applicator after cycle each monthfor prevention. Instructed to call if no relief of symptoms. Alcohol precautions reviewed. Also requested refill of doxycycline 100 takes twice daily as needed for rectal skin boils, does have follow-up with primary carewho prescribes. Keep scheduled annual exam next month with Dr. Toney Rakes. Marland Kitchen

## 2014-07-06 NOTE — Patient Instructions (Signed)
Bacterial Vaginosis Bacterial vaginosis is an infection of the vagina. It happens when too many of certain germs (bacteria) grow in the vagina. HOME CARE  Take your medicine as told by your doctor.  Finish your medicine even if you start to feel better.  Do not have sex until you finish your medicine and are better.  Tell your sex partner that you have an infection. They should see their doctor for treatment.  Practice safe sex. Use condoms. Have only one sex partner. GET HELP IF:  You are not getting better after 3 days of treatment.  You have more grey fluid (discharge) coming from your vagina than before.  You have more pain than before.  You have a fever. MAKE SURE YOU:   Understand these instructions.  Will watch your condition.  Will get help right away if you are not doing well or get worse. Document Released: 05/15/2008 Document Revised: 05/27/2013 Document Reviewed: 03/18/2013 Exit[employer] Patient Information 2015 [employer]. This information is not intended to replace advice given to you by your health care provider. Make sure you discuss any questions you have with your health care provider.  

## 2014-07-07 LAB — URINALYSIS W MICROSCOPIC + REFLEX CULTURE
Bacteria, UA: NONE SEEN
Bilirubin Urine: NEGATIVE
CASTS: NONE SEEN
Crystals: NONE SEEN
GLUCOSE, UA: NEGATIVE mg/dL
HGB URINE DIPSTICK: NEGATIVE
Ketones, ur: NEGATIVE mg/dL
LEUKOCYTES UA: NEGATIVE
Nitrite: NEGATIVE
PH: 6 (ref 5.0–8.0)
Protein, ur: NEGATIVE mg/dL
Specific Gravity, Urine: 1.005 (ref 1.005–1.030)
Squamous Epithelial / LPF: NONE SEEN
Urobilinogen, UA: 0.2 mg/dL (ref 0.0–1.0)

## 2014-08-02 ENCOUNTER — Encounter: Payer: Managed Care, Other (non HMO) | Admitting: Gynecology

## 2014-08-06 ENCOUNTER — Encounter: Payer: Managed Care, Other (non HMO) | Admitting: Gynecology

## 2014-09-16 ENCOUNTER — Encounter: Payer: Managed Care, Other (non HMO) | Admitting: Gynecology

## 2014-12-24 ENCOUNTER — Telehealth: Payer: Self-pay | Admitting: *Deleted

## 2014-12-24 NOTE — Telephone Encounter (Signed)
Pt called c/o yeast infection requesting diflucan tablet, last seen in office for problem visit was 06/2014. Last annual is 04/2013. I advised pt OV needed for Rx. Pt said she will call back.

## 2015-10-21 ENCOUNTER — Encounter: Payer: Self-pay | Admitting: Gastroenterology

## 2015-11-09 ENCOUNTER — Other Ambulatory Visit: Payer: Self-pay

## 2015-11-09 ENCOUNTER — Ambulatory Visit (INDEPENDENT_AMBULATORY_CARE_PROVIDER_SITE_OTHER): Payer: BLUE CROSS/BLUE SHIELD | Admitting: Gastroenterology

## 2015-11-09 ENCOUNTER — Encounter: Payer: Self-pay | Admitting: Gastroenterology

## 2015-11-09 VITALS — BP 154/89 | HR 88 | Temp 98.5°F | Ht 66.0 in | Wt 213.4 lb

## 2015-11-09 DIAGNOSIS — R1013 Epigastric pain: Secondary | ICD-10-CM | POA: Diagnosis not present

## 2015-11-09 DIAGNOSIS — Z1211 Encounter for screening for malignant neoplasm of colon: Secondary | ICD-10-CM | POA: Diagnosis not present

## 2015-11-09 DIAGNOSIS — K219 Gastro-esophageal reflux disease without esophagitis: Secondary | ICD-10-CM

## 2015-11-09 MED ORDER — PEG-KCL-NACL-NASULF-NA ASC-C 100 G PO SOLR: 1.0000 | Freq: Once | ORAL | Status: DC

## 2015-11-09 MED ORDER — PANTOPRAZOLE SODIUM 40 MG PO TBEC: 40.0000 mg | DELAYED_RELEASE_TABLET | Freq: Every day | ORAL | Status: DC

## 2015-11-09 NOTE — Patient Instructions (Signed)
Start taking Protonix once each morning, 30 minutes before breakfast.  I would like for you to try a probiotic once a day for a week (Align, Restora, [place]). I have provided samples. Then, you may try IBgard for one week. Let us know which one you like best.  We have scheduled you for a colonoscopy and possible upper endoscopy with Dr. Oneida Alar in the near future!

## 2015-11-09 NOTE — Progress Notes (Signed)
Primary Care Physician:  Leamon Arnt, MD Primary Gastroenterologist:  Dr. Oneida Alar   Chief Complaint  Patient presents with  . Irritable Bowel Syndrome    HPI:   Ashley Bailey is a 45 y.o. female presenting today at the request of her PCP secondary to IBS. She is here to establish care. She notes a history of constipation-predominant symptoms that are actually controlled well with diet and non-prescriptive measures. She drinks water, hot coffee, and then has a BM every day. Has had scant hematochezia at times. No prior colonoscopy. Notes occasional rectal discomfort. Her main concern is loud rumbling in her stomach but NO pain. Notes bloating. Has had some indigestion, belching, feels like food comes up for the past 2-3 months but no esophageal dysphagia. Sits somewhat heavy in her upper abdomen. Feels full at times. No weight loss or lack of appetite. Sometimes mild nausea. No PPI. Has taken BC powders in past but none currently. Gasx and beano without help for loud stomach rumbles.   Past Medical History  Diagnosis Date  . IBS (irritable bowel syndrome)   . Seasonal allergies   . Edema     Past Surgical History  Procedure Laterality Date  . Cesarean section      X2  . Tonsillectomy and adenoidectomy      Current Outpatient Prescriptions  Medication Sig Dispense Refill  . clonazePAM (KLONOPIN) 0.5 MG tablet Take by mouth.    . hydrochlorothiazide (HYDRODIURIL) 25 MG tablet Take 25 mg by mouth daily.    . hydrOXYzine (VISTARIL) 25 MG capsule      No current facility-administered medications for this visit.    Allergies as of 11/09/2015  . (No Known Allergies)    Family History  Problem Relation Age of Onset  . Hypertension Mother   . Diabetes Sister   . Colon cancer Neg Hx     Social History   Social History  . Marital Status: Divorced    Spouse Name: N/A  . Number of Children: N/A  . Years of Education: N/A   Occupational History  . Not on file.    Social History Main Topics  . Smoking status: Current Every Day Smoker -- 0.50 packs/day    Types: Cigarettes  . Smokeless tobacco: Never Used  . Alcohol Use: 0.0 oz/week    0 Standard drinks or equivalent per week     Comment: occasionally on weekends   . Drug Use: No  . Sexual Activity: No   Other Topics Concern  . Not on file   Social History Narrative    Review of Systems: As mentioned in HPI    Physical Exam: BP 154/89 mmHg  Pulse 88  Temp(Src) 98.5 F (36.9 C) (Oral)  Ht 5\' 6"  (1.676 m)  Wt 213 lb 6.4 oz (96.798 kg)  BMI 34.46 kg/m2  LMP 10/19/2015 General:   Alert and oriented. Pleasant and cooperative. Well-nourished and well-developed.  Head:  Normocephalic and atraumatic. Eyes:  Without icterus, sclera clear and conjunctiva pink.  Ears:  Normal auditory acuity. Nose:  No deformity, discharge,  or lesions. Mouth:  No deformity or lesions, oral mucosa pink.  Lungs:  Clear to auscultation bilaterally. No wheezes, rales, or rhonchi. No distress.  Heart:  S1, S2 present without murmurs appreciated.  Abdomen:  +BS, soft, non-tender and non-distended. No HSM noted. No guarding or rebound. No masses appreciated.  Rectal:  Deferred  Msk:  Symmetrical without gross deformities. Normal posture. Extremities:  Without clubbing or edema.  Neurologic:  Alert and  oriented x4;  grossly normal neurologically. Skin:  Intact without significant lesions or rashes. Psych:  Alert and cooperative. Normal mood and affect.

## 2015-11-09 NOTE — Assessment & Plan Note (Signed)
Occasional reflux, early satiety, belching, and mild nausea. No PPI currently. Likely GERD-related symptoms, gastritis. Remote use of BC powders but none currently. Will trial Protonix once daily. If no improvement in upper GI symptoms by time of colonoscopy, will pursue EGD. Risks and benefits discussed in detail with stated understanding.

## 2015-11-09 NOTE — Assessment & Plan Note (Signed)
45 year old female with a reported history of presumed IBS, constipation predominant, presenting with need to establish care. She is doing well with dietary and behavior modification, only noting loud "rumblings" in abdomen but denies pain. Intermittent low-volume hematochezia likely benign source. Will pursue screening colonoscopy now.   Proceed with colonoscopy with Dr. Oneida Alar in the near future. The risks, benefits, and alternatives have been discussed in detail with the patient. They state understanding and desire to proceed.  Phenergan 25 mg IV on call Trial probiotic once daily, samples of IBgard also provided to trial

## 2015-11-10 NOTE — Progress Notes (Signed)
CC'ED TO PCP 

## 2015-11-21 ENCOUNTER — Telehealth: Payer: Self-pay | Admitting: Gastroenterology

## 2015-11-21 MED ORDER — RESTORA PO CAPS: 1.0000 | ORAL_CAPSULE | Freq: Every day | ORAL | Status: DC

## 2015-11-21 NOTE — Telephone Encounter (Signed)
Forwarding to the refill box.  

## 2015-11-21 NOTE — Telephone Encounter (Signed)
PLEASE CALL PATIENT REGARDING RESTORA PRESCRIPTION, PHARMACY TOLD HER THAT IF SHE HAD A SCRIPT SHE COULD GET CHEAPER

## 2015-11-21 NOTE — Addendum Note (Signed)
Addended by: Orvil Feil on: 11/21/2015 12:22 PM   Modules accepted: Orders

## 2015-11-21 NOTE — Telephone Encounter (Signed)
I sent in Odin.

## 2015-11-21 NOTE — Telephone Encounter (Signed)
PT is aware.

## 2015-12-06 ENCOUNTER — Encounter (HOSPITAL_COMMUNITY)
Admission: RE | Disposition: A | Discharge status: Home / Self Care | Payer: Self-pay | Source: Ambulatory Visit | Attending: Gastroenterology

## 2015-12-06 ENCOUNTER — Encounter (HOSPITAL_COMMUNITY): Payer: Self-pay

## 2015-12-06 ENCOUNTER — Ambulatory Visit (HOSPITAL_COMMUNITY)
Admission: RE | Admit: 2015-12-06 | Discharge: 2015-12-06 | Disposition: A | Discharge status: Home / Self Care | Payer: BLUE CROSS/BLUE SHIELD | Source: Ambulatory Visit | Attending: Gastroenterology | Admitting: Gastroenterology

## 2015-12-06 DIAGNOSIS — K2970 Gastritis, unspecified, without bleeding: Secondary | ICD-10-CM | POA: Diagnosis not present

## 2015-12-06 DIAGNOSIS — K648 Other hemorrhoids: Secondary | ICD-10-CM | POA: Insufficient documentation

## 2015-12-06 DIAGNOSIS — F17210 Nicotine dependence, cigarettes, uncomplicated: Secondary | ICD-10-CM | POA: Insufficient documentation

## 2015-12-06 DIAGNOSIS — Z1211 Encounter for screening for malignant neoplasm of colon: Secondary | ICD-10-CM | POA: Diagnosis not present

## 2015-12-06 DIAGNOSIS — R1013 Epigastric pain: Secondary | ICD-10-CM | POA: Diagnosis present

## 2015-12-06 DIAGNOSIS — K219 Gastro-esophageal reflux disease without esophagitis: Secondary | ICD-10-CM | POA: Insufficient documentation

## 2015-12-06 HISTORY — PX: COLONOSCOPY: SHX5424

## 2015-12-06 HISTORY — PX: ESOPHAGOGASTRODUODENOSCOPY: SHX5428

## 2015-12-06 SURGERY — COLONOSCOPY
Anesthesia: Moderate Sedation

## 2015-12-06 MED ORDER — STERILE WATER FOR IRRIGATION IR SOLN
Status: DC | PRN
  Administered 2015-12-06: 12:00:00

## 2015-12-06 MED ORDER — SODIUM CHLORIDE 0.9 % IV SOLN
INTRAVENOUS | Status: DC
  Administered 2015-12-06: 12:00:00 via INTRAVENOUS

## 2015-12-06 MED ORDER — MINERAL OIL PO OIL
TOPICAL_OIL | ORAL | Status: AC
  Filled 2015-12-06: qty 30

## 2015-12-06 MED ORDER — MIDAZOLAM HCL 5 MG/5ML IJ SOLN
INTRAMUSCULAR | Status: DC | PRN
  Administered 2015-12-06 (×2): 2 mg via INTRAVENOUS
  Administered 2015-12-06: 1 mg via INTRAVENOUS
  Administered 2015-12-06: 2 mg via INTRAVENOUS

## 2015-12-06 MED ORDER — MEPERIDINE HCL 100 MG/ML IJ SOLN
INTRAMUSCULAR | Status: AC
  Filled 2015-12-06: qty 2

## 2015-12-06 MED ORDER — LIDOCAINE VISCOUS 2 % MT SOLN
OROMUCOSAL | Status: AC
  Filled 2015-12-06: qty 15

## 2015-12-06 MED ORDER — MIDAZOLAM HCL 5 MG/5ML IJ SOLN
INTRAMUSCULAR | Status: AC
  Filled 2015-12-06: qty 10

## 2015-12-06 MED ORDER — MEPERIDINE HCL 100 MG/ML IJ SOLN
INTRAMUSCULAR | Status: DC | PRN
Start: 2015-12-06 — End: 2015-12-06
  Administered 2015-12-06 (×2): 25 mg via INTRAVENOUS
  Administered 2015-12-06: 50 mg via INTRAVENOUS
  Administered 2015-12-06: 25 mg via INTRAVENOUS

## 2015-12-06 MED ORDER — PROMETHAZINE HCL 25 MG/ML IJ SOLN
25.0000 mg | Freq: Once | INTRAMUSCULAR | Status: AC
  Administered 2015-12-06: 25 mg via INTRAVENOUS

## 2015-12-06 NOTE — Progress Notes (Signed)
REVIEWED-NO ADDITIONAL RECOMMENDATIONS. 

## 2015-12-06 NOTE — H&P (Addendum)
  Primary Care Physician:  Leamon Arnt, MD Primary Gastroenterologist:  Dr. Oneida Alar  Pre-Procedure History & Physical: HPI:  Ashley Bailey is a 45 y.o. female here for screening/dyspepsia.  Past Medical History  Diagnosis Date  . IBS (irritable bowel syndrome)   . Seasonal allergies   . Edema     Past Surgical History  Procedure Laterality Date  . Cesarean section      X2  . Tonsillectomy and adenoidectomy      Prior to Admission medications   Medication Sig Start Date End Date Taking? Authorizing Provider  clonazePAM (KLONOPIN) 0.5 MG tablet Take by mouth. 10/13/15 12/12/15  Historical Provider, MD  hydrochlorothiazide (HYDRODIURIL) 25 MG tablet Take 25 mg by mouth daily.    Historical Provider, MD  hydrOXYzine (VISTARIL) 25 MG capsule  11/18/14   Historical Provider, MD  pantoprazole (PROTONIX) 40 MG tablet Take 1 tablet (40 mg total) by mouth daily. 11/09/15   Orvil Feil, NP  peg 3350 powder (MOVIPREP) 100 g SOLR Take 1 kit (200 g total) by mouth once. 11/09/15 12/09/15  Orvil Feil, NP  Probiotic Product (RESTORA) CAPS Take 1 capsule by mouth daily. 11/21/15   Orvil Feil, NP    Allergies as of 11/09/2015  . (No Known Allergies)    Family History  Problem Relation Age of Onset  . Hypertension Mother   . Diabetes Sister   . Colon cancer Neg Hx     Social History   Social History  . Marital Status: Divorced    Spouse Name: N/A  . Number of Children: N/A  . Years of Education: N/A   Occupational History  . Not on file.   Social History Main Topics  . Smoking status: Current Every Day Smoker -- 0.50 packs/day    Types: Cigarettes  . Smokeless tobacco: Never Used  . Alcohol Use: 0.0 oz/week    0 Standard drinks or equivalent per week     Comment: occasionally on weekends   . Drug Use: No  . Sexual Activity: No   Other Topics Concern  . Not on file   Social History Narrative    Review of Systems: See HPI, otherwise negative ROS   Physical  Exam: LMP 10/19/2015 General:   Alert,  pleasant and cooperative in NAD Head:  Normocephalic and atraumatic. Neck:  Supple; Lungs:  Clear throughout to auscultation.    Heart:  Regular rate and rhythm. Abdomen:  Soft, nontender and nondistended. Normal bowel sounds, without guarding, and without rebound.   Neurologic:  Alert and  oriented x4;  grossly normal neurologically.  Impression/Plan:    screening/DYSPEPSIA  PLAN: EGD/TCS TODAY

## 2015-12-06 NOTE — Discharge Instructions (Addendum)
YOU have GASTRITIS. YOU DID NOT HAVE ANY POLYPS. You have internal hemorrhoids. I biopsied your stomach.   DRINK WATER TO KEEP YOUR URINE LIGHT YELLOW.  CONTINUE YOUR WEIGHT LOSS EFFORTS. LOSE 10 POUNDS.  FOLLOW A HIGH FIBER/low fat DIET. AVOID ITEMS THAT CAUSE BLOATING. SEE INFO BELOW ON A HIGH FIBER/low fat DIET.  TAKE A PROBIOTIC DAILY FOR three MONTHs ([facility], [place]).    TAKE PROTONIX 30 MINUTES PRIOR TO first MEAL DAILY.  AVOID ITEMS THAT TRIGGER GASTRITIS. SEE INFO BELOW.  YOUR BIOPSY RESULTS WILL BE AVAILABLE IN MY CHART APR 21 AND MY OFFICE WILL CONTACT YOU IN 10-14 DAYS WITH YOUR RESULTS.   FOLLOW UP IN 4 MOS.     ENDOSCOPY Care After Read the instructions outlined below and refer to this sheet in the next week. These discharge instructions provide you with general information on caring for yourself after you leave the hospital. While your treatment has been planned according to the most current medical practices available, unavoidable complications occasionally occur. If you have any problems or questions after discharge, call DR. Loghan Subia, [phone].  ACTIVITY  You may resume your regular activity, but move at a slower pace for the next 24 hours.   Take frequent rest periods for the next 24 hours.   Walking will help get rid of the air and reduce the bloated feeling in your belly (abdomen).   No driving for 24 hours (because of the medicine (anesthesia) used during the test).   You may shower.   Do not sign any important legal documents or operate any machinery for 24 hours (because of the anesthesia used during the test).    NUTRITION  Drink plenty of fluids.   You may resume your normal diet as instructed by your doctor.   Begin with a light meal and progress to your normal diet. Heavy or fried foods are harder to digest and may make you feel sick to your stomach (nauseated).   Avoid alcoholic beverages for 24 hours or as  instructed.    MEDICATIONS  You may resume your normal medications.   WHAT YOU CAN EXPECT TODAY  Some feelings of bloating in the abdomen.   Passage of more gas than usual.   Spotting of blood in your stool or on the toilet paper  .  IF YOU HAD POLYPS REMOVED DURING THE ENDOSCOPY:  Eat a soft diet IF YOU HAVE NAUSEA, BLOATING, ABDOMINAL PAIN, OR VOMITING.    FINDING OUT THE RESULTS OF YOUR TEST Not all test results are available during your visit. DR. Oneida Alar WILL CALL YOU WITHIN 14 DAYS OF YOUR PROCEDUE WITH YOUR RESULTS. Do not assume everything is normal if you have not heard from DR. Rosan Calbert, CALL HER OFFICE AT [phone].  SEEK IMMEDIATE MEDICAL ATTENTION AND CALL THE OFFICE: [phone] IF:  You have more than a spotting of blood in your stool.   Your belly is swollen (abdominal distention).   You are nauseated or vomiting.   You have a temperature over 101F.   You have abdominal pain or discomfort that is severe or gets worse throughout the day.  High-Fiber Diet A high-fiber diet changes your normal diet to include more whole grains, legumes, fruits, and vegetables. Changes in the diet involve replacing refined carbohydrates with unrefined foods. The calorie level of the diet is essentially unchanged. The Dietary Reference Intake (recommended amount) for adult males is 38 grams per day. For adult females, it is 25 grams per  day. Pregnant and lactating women should consume 28 grams of fiber per day. Fiber is the intact part of a plant that is not broken down during digestion. Functional fiber is fiber that has been isolated from the plant to provide a beneficial effect in the body. PURPOSE  Increase stool bulk.   Ease and regulate bowel movements.   Lower cholesterol.  REDUCE RISK OF COLON CANCER  INDICATIONS THAT YOU NEED MORE FIBER  Constipation and hemorrhoids.   Uncomplicated diverticulosis (intestine condition) and irritable bowel syndrome.   Weight  management.   As a protective measure against hardening of the arteries (atherosclerosis), diabetes, and cancer.   GUIDELINES FOR INCREASING FIBER IN THE DIET  Start adding fiber to the diet slowly. A gradual increase of about 5 more grams (2 slices of whole-wheat bread, 2 servings of most fruits or vegetables, or 1 bowl of high-fiber cereal) per day is best. Too rapid an increase in fiber may result in constipation, flatulence, and bloating.   Drink enough water and fluids to keep your urine clear or pale yellow. Water, juice, or caffeine-free drinks are recommended. Not drinking enough fluid may cause constipation.   Eat a variety of high-fiber foods rather than one type of fiber.   Try to increase your intake of fiber through using high-fiber foods rather than fiber pills or supplements that contain small amounts of fiber.   The goal is to change the types of food eaten. Do not supplement your present diet with high-fiber foods, but replace foods in your present diet.   INCLUDE A VARIETY OF FIBER SOURCES  Replace refined and processed grains with whole grains, canned fruits with fresh fruits, and incorporate other fiber sources. White rice, white breads, and most bakery goods contain little or no fiber.   Brown whole-grain rice, buckwheat oats, and many fruits and vegetables are all good sources of fiber. These include: broccoli, Brussels sprouts, cabbage, cauliflower, beets, sweet potatoes, white potatoes (skin on), carrots, tomatoes, eggplant, squash, berries, fresh fruits, and dried fruits.   Cereals appear to be the richest source of fiber. Cereal fiber is found in whole grains and bran. Bran is the fiber-rich outer coat of cereal grain, which is largely removed in refining. In whole-grain cereals, the bran remains. In breakfast cereals, the largest amount of fiber is found in those with "bran" in their names. The fiber content is sometimes indicated on the label.   You may need to  include additional fruits and vegetables each day.   In baking, for 1 cup white flour, you may use the following substitutions:   1 cup whole-wheat flour minus 2 tablespoons.   1/2 cup white flour plus 1/2 cup whole-wheat flour.   Low-Fat Diet BREADS, CEREALS, PASTA, RICE, DRIED PEAS, AND BEANS These products are high in carbohydrates and most are low in fat. Therefore, they can be increased in the diet as substitutes for fatty foods. They too, however, contain calories and should not be eaten in excess. Cereals can be eaten for snacks as well as for breakfast.   FRUITS AND VEGETABLES It is good to eat fruits and vegetables. Besides being sources of fiber, both are rich in vitamins and some minerals. They help you get the daily allowances of these nutrients. Fruits and vegetables can be used for snacks and desserts.  MEATS Limit lean meat, chicken, [place], and fish to no more than 6 ounces per day. Beef, Pork, and Lamb Use lean cuts of beef, pork, and lamb. [employer]  cuts include:  Extra-lean ground beef.  Arm roast.  Sirloin tip.  Center-cut ham.  Round steak.  Loin chops.  Rump roast.  Tenderloin.  Trim all fat off the outside of meats before cooking. It is not necessary to severely decrease the intake of red meat, but lean choices should be made. Lean meat is rich in protein and contains a highly absorbable form of iron. Premenopausal women, in particular, should avoid reducing lean red meat because this could increase the risk for low red blood cells (iron-deficiency anemia).  Chicken and [place] These are good sources of protein. The fat of poultry can be reduced by removing the skin and underlying fat layers before cooking. Chicken and [place] can be substituted for lean red meat in the diet. Poultry should not be fried or covered with high-fat sauces. Fish and Shellfish Fish is a good source of protein. Shellfish contain cholesterol, but they usually are low in saturated fatty acids.  The preparation of fish is important. Like chicken and [place], they should not be fried or covered with high-fat sauces. EGGS Egg whites contain no fat or cholesterol. They can be eaten often. Try 1 to 2 egg whites instead of whole eggs in recipes or use egg substitutes that do not contain yolk. MILK AND DAIRY PRODUCTS Use skim or 1% milk instead of 2% or whole milk. Decrease whole milk, natural, and processed cheeses. Use nonfat or low-fat (2%) cottage cheese or low-fat cheeses made from vegetable oils. Choose nonfat or low-fat (1 to 2%) yogurt. Experiment with evaporated skim milk in recipes that call for heavy cream. Substitute low-fat yogurt or low-fat cottage cheese for sour cream in dips and salad dressings. Have at least 2 servings of low-fat dairy products, such as 2 glasses of skim (or 1%) milk each day to help get your daily calcium intake. FATS AND OILS Reduce the total intake of fats, especially saturated fat. Butterfat, lard, and beef fats are high in saturated fat and cholesterol. These should be avoided as much as possible. Vegetable fats do not contain cholesterol, but certain vegetable fats, such as coconut oil, palm oil, and palm kernel oil are very high in saturated fats. These should be limited. These fats are often used in bakery goods, processed foods, popcorn, oils, and nondairy creamers. Vegetable shortenings and some peanut butters contain hydrogenated oils, which are also saturated fats. Read the labels on these foods and check for saturated vegetable oils. Unsaturated vegetable oils and fats do not raise blood cholesterol. However, they should be limited because they are fats and are high in calories. Total fat should still be limited to 30% of your daily caloric intake. Desirable liquid vegetable oils are corn oil, cottonseed oil, olive oil, canola oil, safflower oil, soybean oil, and sunflower oil. Peanut oil is not as good, but small amounts are acceptable. Buy a heart-healthy  tub margarine that has no partially hydrogenated oils in the ingredients. Mayonnaise and salad dressings often are made from unsaturated fats, but they should also be limited because of their high calorie and fat content. Seeds, nuts, peanut butter, olives, and avocados are high in fat, but the fat is mainly the unsaturated type. These foods should be limited mainly to avoid excess calories and fat. OTHER EATING TIPS Snacks  Most sweets should be limited as snacks. They tend to be rich in calories and fats, and their caloric content outweighs their nutritional value. Some good choices in snacks are graham crackers, melba toast, soda crackers, bagels (  no egg), English muffins, fruits, and vegetables. These snacks are preferable to snack crackers, [place] fries, TORTILLA CHIPS, and POTATO chips. Popcorn should be air-popped or cooked in small amounts of liquid vegetable oil. Desserts Eat fruit, low-fat yogurt, and fruit ices instead of pastries, cake, and cookies. Sherbet, angel food cake, gelatin dessert, frozen low-fat yogurt, or other frozen products that do not contain saturated fat (pure fruit juice bars, frozen ice pops) are also acceptable.  COOKING METHODS Choose those methods that use little or no fat. They include: Poaching.  Braising.  Steaming.  Grilling.  Baking.  Stir-frying.  Broiling.  Microwaving.  Foods can be cooked in a nonstick pan without added fat, or use a nonfat cooking spray in regular cookware. Limit fried foods and avoid frying in saturated fat. Add moisture to lean meats by using water, broth, cooking wines, and other nonfat or low-fat sauces along with the cooking methods mentioned above. Soups and stews should be chilled after cooking. The fat that forms on top after a few hours in the refrigerator should be skimmed off. When preparing meals, avoid using excess salt. Salt can contribute to raising blood pressure in some people.  EATING AWAY FROM HOME Order entres,  potatoes, and vegetables without sauces or butter. When meat exceeds the size of a deck of cards (3 to 4 ounces), the rest can be taken home for another meal. Choose vegetable or fruit salads and ask for low-calorie salad dressings to be served on the side. Use dressings sparingly. Limit high-fat toppings, such as bacon, crumbled eggs, cheese, sunflower seeds, and olives. Ask for heart-healthy tub margarine instead of butter.    Gastritis  Gastritis is an inflammation (the body's way of reacting to injury and/or infection) of the stomach. It can also be caused BY ASPIRIN, BC/GOODY POWDER'S, (IBUPROFEN) MOTRIN, OR ALEVE (NAPROXEN), chemicals (including alcohol), SPICY FOODS, and medications. This illness may be associated with generalized malaise (feeling tired, not well), UPPER ABDOMINAL STOMACH cramps, and fever. One common bacterial cause of gastritis is an organism known as H. Pylori. This can be treated with antibiotics.      Hemorrhoids Hemorrhoids are dilated (enlarged) veins around the rectum. Sometimes clots will form in the veins. This makes them swollen and painful. These are called thrombosed hemorrhoids. Causes of hemorrhoids include:  Constipation.   Straining to have a bowel movement.   HEAVY LIFTING  HOME CARE INSTRUCTIONS  Eat a well balanced diet and drink 6 to 8 glasses of water every day to avoid constipation. You may also use a bulk laxative.   Avoid straining to have bowel movements.   Keep anal area dry and clean.   Do not use a donut shaped pillow or sit on the toilet for long periods. This increases blood pooling and pain.   Move your bowels when your body has the urge; this will require less straining and will decrease pain and pressure.

## 2015-12-08 NOTE — Op Note (Signed)
[facility] Patient Name: Ashley Bailey Procedure Date: 12/06/2015 12:34 PM MRN: [id number] Date of Birth: 26-Jul-1971 Attending MD: Barney Drain , MD CSN: [id number] Age: 45 Admit Type: Outpatient Procedure:                Upper GI endoscopy Indications:              Dyspepsia Providers:                Barney Drain, MD, Gwenlyn Fudge, RN, Randa Spike,                            Technician Referring MD:             Karie Fetch. Jonni Sanger Medicines:                Meperidine 25 mg IV, Midazolam 2 mg IV + TCS Complications:            No immediate complications. Estimated Blood Loss:     Estimated blood loss was minimal. Procedure:                Pre-Anesthesia Assessment:                           - Prior to the procedure, a History and Physical                            was performed, and patient medications and                            allergies were reviewed. The patient's tolerance of                            previous anesthesia was also reviewed. The risks                            and benefits of the procedure and the sedation                            options and risks were discussed with the patient.                            All questions were answered, and informed consent                            was obtained. Prior Anticoagulants: The patient has                            taken no previous anticoagulant or antiplatelet                            agents. ASA Grade Assessment: II - A patient with                            mild systemic disease. After reviewing the risks  and benefits, the patient was deemed in                            satisfactory condition to undergo the procedure.                           After obtaining informed consent, the endoscope was                            passed under direct vision. Throughout the                            procedure, the patient's blood pressure, pulse, and    oxygen saturations were monitored continuously. The                            [id number]) was introduced through the                            mouth, and advanced to the second part of duodenum.                            The upper GI endoscopy was accomplished without                            difficulty. The patient tolerated the procedure                            well. Scope In: 12:39:11 PM Scope Out: 12:43:00 PM Total Procedure Duration: 0 hours 3 minutes 49 seconds  Findings:      The examined esophagus was normal.      Mild inflammation characterized by congestion (edema) and erythema was       found in the gastric antrum. Biopsies were taken with a cold forceps for       Helicobacter pylori testing.      The duodenal bulb and second portion of the duodenum were normal. Impression:               - Normal esophagus.                           - Gastritis. Biopsied.                           - Normal duodenal bulb and second portion of the                            duodenum. Moderate Sedation:      Moderate (conscious) sedation was administered by the endoscopy nurse       and supervised by the endoscopist. The following parameters were       monitored: oxygen saturation, heart rate, blood pressure, and response       to care. Total physician intraservice time was 35 minutes. Recommendation:           - Patient has a contact number available for  emergencies. The signs and symptoms of potential                            delayed complications were discussed with the                            patient. Return to normal activities tomorrow.                            Written discharge instructions were provided to the                            patient.                           - High fiber diet and low fat diet.                           - Continue present medications.                           - Await pathology results.                            - Return to my office in 4 months. Procedure Code(s):        --- Professional ---                           [phone], Esophagogastroduodenoscopy, flexible,                            transoral; with biopsy, single or multiple                           99153, Moderate sedation services; each additional                            15 minutes intraservice time                           G0500, Moderate sedation services provided by the                            same physician or other qualified health care                            professional performing a gastrointestinal                            endoscopic service that sedation supports,                            requiring the presence of an independent trained                            observer to assist in the monitoring of the  patient's level of consciousness and physiological                            status; initial 15 minutes of intra-service time;                            patient age 30 years or older (additional time may                            be reported with [phone], as appropriate) Diagnosis Code(s):        --- Professional ---                           K29.70, Gastritis, unspecified, without bleeding                           R10.13, Epigastric pain CPT copyright 2016 American [employer]. All rights reserved. The codes documented in this report are preliminary and upon coder review may  be revised to meet current compliance requirements. Barney Drain, MD Barney Drain, MD 12/08/2015 1:45:50 PM This report has been signed electronically. Number of Addenda: 0

## 2015-12-08 NOTE — Op Note (Signed)
[facility] Patient Name: Ashley Bailey Procedure Date: 12/06/2015 11:54 AM MRN: [id number] Date of Birth: 06/11/1971 Attending MD: Barney Drain , MD CSN: [id number] Age: 45 Admit Type: Outpatient Procedure:                Colonoscopy Indications:              Screening for colorectal malignant neoplasm Providers:                Barney Drain, MD, Gwenlyn Fudge, RN, Randa Spike,                            Technician Referring MD:             Karie Fetch. Jonni Sanger Medicines:                Promethazine 25 mg IV, Meperidine 100 mg IV,                            Midazolam 5 mg IV Complications:            No immediate complications. Estimated blood loss:                            Minimal. Estimated Blood Loss:     Estimated blood loss was minimal. Procedure:                Pre-Anesthesia Assessment:                           - Prior to the procedure, a History and Physical                            was performed, and patient medications and                            allergies were reviewed. The patient's tolerance of                            previous anesthesia was also reviewed. The risks                            and benefits of the procedure and the sedation                            options and risks were discussed with the patient.                            All questions were answered, and informed consent                            was obtained. Prior Anticoagulants: The patient has                            taken no previous anticoagulant or antiplatelet  agents. ASA Grade Assessment: II - A patient with                            mild systemic disease. After reviewing the risks                            and benefits, the patient was deemed in                            satisfactory condition to undergo the procedure.                           After obtaining informed consent, the colonoscope                            was passed under  direct vision. Throughout the                            procedure, the patient's blood pressure, pulse, and                            oxygen saturations were monitored continuously. The                            EC-[id number] scope was introduced through                            the anus and advanced to the the cecum, identified                            by appendiceal orifice and ileocecal valve. The                            ileocecal valve, appendiceal orifice, and rectum                            were photographed. The colonoscopy was performed                            with ease. The patient tolerated the procedure                            well. The quality of the bowel preparation was                            excellent. Scope In: 12:23:02 PM Scope Out: 12:31:45 PM Scope Withdrawal Time: 0 hours 6 minutes 47 seconds  Total Procedure Duration: 0 hours 8 minutes 43 seconds  Findings:      The digital rectal exam findings include non-thrombosed external       hemorrhoids.      The colon (entire examined portion) appeared normal.      Non-bleeding internal hemorrhoids were found. Impression:               - Non-thrombosed external hemorrhoids  found on                            digital rectal exam.                           - The entire examined colon is normal.                           - Non-bleeding internal hemorrhoids.                           - No specimens collected. Moderate Sedation:      Moderate (conscious) sedation was administered by the endoscopy nurse       and supervised by the endoscopist. The following parameters were       monitored: oxygen saturation, heart rate, blood pressure, and response       to care. Total physician intraservice time was 35 minutes. Recommendation:           - Patient has a contact number available for                            emergencies. The signs and symptoms of potential                            delayed  complications were discussed with the                            patient. Return to normal activities tomorrow.                            Written discharge instructions were provided to the                            patient.                           - High fiber diet.                           - Continue present medications.                           - Await pathology results.                           - Repeat colonoscopy in 10 years for surveillance.                           - Return to my office in 4 months. Procedure Code(s):        --- Professional ---                           [id number], Colorectal cancer screening; colonoscopy on                            individual not meeting  criteria for high risk                           99153, Moderate sedation services; each additional                            15 minutes intraservice time                           G0500, Moderate sedation services provided by the                            same physician or other qualified health care                            professional performing a gastrointestinal                            endoscopic service that sedation supports,                            requiring the presence of an independent trained                            observer to assist in the monitoring of the                            patient's level of consciousness and physiological                            status; initial 15 minutes of intra-service time;                            patient age 67 years or older (additional time may                            be reported with [phone], as appropriate) Diagnosis Code(s):        --- Professional ---                           Z12.11, Encounter for screening for malignant                            neoplasm of colon                           K64.4, Residual hemorrhoidal skin tags                           K64.8, Other hemorrhoids CPT copyright 2016 American [employer] Association. All  rights reserved. The codes documented in this report are preliminary and upon coder review may  be revised to meet current compliance requirements. Barney Drain, MD Barney Drain, MD 12/08/2015 1:42:03 PM This report has been signed electronically. Number of Addenda: 0

## 2015-12-12 ENCOUNTER — Encounter (HOSPITAL_COMMUNITY): Payer: Self-pay | Admitting: Gastroenterology

## 2015-12-14 ENCOUNTER — Telehealth: Payer: Self-pay | Admitting: Gastroenterology

## 2015-12-14 ENCOUNTER — Encounter: Payer: Self-pay | Admitting: Gastroenterology

## 2015-12-14 NOTE — Telephone Encounter (Signed)
Reminder in epic °

## 2015-12-14 NOTE — Telephone Encounter (Addendum)
Please call pt. HER stomach Bx shows gastritis.   DRINK WATER TO KEEP YOUR URINE LIGHT YELLOW. CONTINUE YOUR WEIGHT LOSS EFFORTS. LOSE 10 POUNDS. FOLLOW A HIGH FIBER/low fat DIET. AVOID ITEMS THAT CAUSE BLOATING.  TAKE A PROBIOTIC DAILY FOR three MONTHs ([facility], [place]).  TAKE PROTONIX 30 MINUTES PRIOR TO first MEAL DAILY. AVOID ITEMS THAT TRIGGER GASTRITIS.   FOLLOW UP IN 4 MOS E30 DYSPEPSIA/GASTRITIS/GERD. NEXT TCS IN 10 YEARS.

## 2015-12-14 NOTE — Telephone Encounter (Signed)
Patient sent an email requesting path results. I informed her of the results and to continue Protonix once daily.

## 2015-12-14 NOTE — Telephone Encounter (Signed)
Noted  

## 2015-12-14 NOTE — Telephone Encounter (Signed)
LMOM to call.

## 2015-12-15 NOTE — Telephone Encounter (Signed)
Pt is aware of results and recommendations.  

## 2015-12-22 ENCOUNTER — Ambulatory Visit (INDEPENDENT_AMBULATORY_CARE_PROVIDER_SITE_OTHER): Payer: BLUE CROSS/BLUE SHIELD | Admitting: Gynecology

## 2015-12-22 ENCOUNTER — Other Ambulatory Visit: Payer: Self-pay | Admitting: Gynecology

## 2015-12-22 ENCOUNTER — Encounter: Payer: Self-pay | Admitting: Gynecology

## 2015-12-22 ENCOUNTER — Ambulatory Visit (INDEPENDENT_AMBULATORY_CARE_PROVIDER_SITE_OTHER): Payer: BLUE CROSS/BLUE SHIELD

## 2015-12-22 VITALS — BP 130/86

## 2015-12-22 DIAGNOSIS — N9489 Other specified conditions associated with female genital organs and menstrual cycle: Secondary | ICD-10-CM

## 2015-12-22 DIAGNOSIS — R102 Pelvic and perineal pain unspecified side: Secondary | ICD-10-CM

## 2015-12-22 DIAGNOSIS — N83201 Unspecified ovarian cyst, right side: Secondary | ICD-10-CM

## 2015-12-22 DIAGNOSIS — R110 Nausea: Secondary | ICD-10-CM

## 2015-12-22 DIAGNOSIS — N898 Other specified noninflammatory disorders of vagina: Secondary | ICD-10-CM | POA: Diagnosis not present

## 2015-12-22 DIAGNOSIS — N859 Noninflammatory disorder of uterus, unspecified: Secondary | ICD-10-CM

## 2015-12-22 DIAGNOSIS — D251 Intramural leiomyoma of uterus: Secondary | ICD-10-CM | POA: Diagnosis not present

## 2015-12-22 LAB — URINALYSIS W MICROSCOPIC + REFLEX CULTURE
BILIRUBIN URINE: NEGATIVE
CRYSTALS: NONE SEEN [HPF]
Casts: NONE SEEN [LPF]
Glucose, UA: NEGATIVE
HGB URINE DIPSTICK: NEGATIVE
KETONES UR: NEGATIVE
Nitrite: NEGATIVE
PROTEIN: NEGATIVE
Specific Gravity, Urine: <1.005 (ref 1.001–1.035)
Yeast: NONE SEEN [HPF]
pH: 5 (ref 5.0–8.0)

## 2015-12-22 LAB — WET PREP FOR TRICH, YEAST, CLUE
Clue Cells Wet Prep HPF POC: NONE SEEN
Trich, Wet Prep: NONE SEEN
Yeast Wet Prep HPF POC: NONE SEEN

## 2015-12-22 MED ORDER — MEDROXYPROGESTERONE ACETATE 150 MG/ML IM SUSP
150.0000 mg | Freq: Once | INTRAMUSCULAR | Status: AC
  Administered 2015-12-22: 150 mg via INTRAMUSCULAR

## 2015-12-22 NOTE — Patient Instructions (Signed)
Endometrial Biopsy Endometrial biopsy is a procedure in which a tissue sample is taken from inside the uterus. The tissue sample is then looked at under a microscope to see if the tissue is normal or abnormal. The endometrium is the lining of the uterus. This procedure helps determine where you are in your menstrual cycle and how hormone levels are affecting the lining of the uterus. This procedure may also be used to evaluate uterine bleeding or to diagnose endometrial cancer, tuberculosis, polyps, or inflammatory conditions.  LET YOUR HEALTH CARE PROVIDER KNOW ABOUT:  Any allergies you have.  All medicines you are taking, including vitamins, herbs, eye drops, creams, and over-the-counter medicines.  Previous problems you or members of your family have had with the use of anesthetics.  Any blood disorders you have.  Previous surgeries you have had.  Medical conditions you have.  Possibility of pregnancy. RISKS AND COMPLICATIONS Generally, this is a safe procedure. However, as with any procedure, complications can occur. Possible complications include:  Bleeding.  Pelvic infection.  Puncture of the uterine wall with the biopsy device (rare). BEFORE THE PROCEDURE   Keep a record of your menstrual cycles as directed by your health care provider. You may need to schedule your procedure for a specific time in your cycle.  You may want to bring a sanitary pad to wear home after the procedure.  Arrange for someone to drive you home after the procedure if you will be given a medicine to help you relax (sedative). PROCEDURE   You may be given a sedative to relax you.  You will lie on an exam table with your feet and legs supported as in a pelvic exam.  Your health care provider will insert an instrument (speculum) into your vagina to see your cervix.  Your cervix will be cleansed with an antiseptic solution. A medicine (local anesthetic) will be used to numb the cervix.  A forceps  instrument (tenaculum) will be used to hold your cervix steady for the biopsy.  A thin, rodlike instrument (uterine sound) will be inserted through your cervix to determine the length of your uterus and the location where the biopsy sample will be removed.  A thin, flexible tube (catheter) will be inserted through your cervix and into the uterus. The catheter is used to collect the biopsy sample from your endometrial tissue.  The catheter and speculum will then be removed, and the tissue sample will be sent to a lab for examination. AFTER THE PROCEDURE  You will rest in a recovery area until you are ready to go home.  You may have mild cramping and a small amount of vaginal bleeding for a few days after the procedure. This is normal.  Make sure you find out how to get your test results.   This information is not intended to replace advice given to you by your health care provider. Make sure you discuss any questions you have with your health care provider.   Document Released: 12/07/2004 Document Revised: 04/08/2013 Document Reviewed: 01/21/2013 Elsevier Interactive Patient Education 2016 Else[employer]. Endometrial Biopsy, Care After Refer to this sheet in the next few weeks. These instructions provide you with information on caring for yourself after your procedure. Your health care provider may also give you more specific instructions. Your treatment has been planned according to current medical practices, but problems sometimes occur. Call your health care provider if you have any problems or questions after your procedure. WHAT TO EXPECT AFTER THE PROCEDURE After   your procedure, it is typical to have the following:  You may have mild cramping and a small amount of vaginal bleeding for a few days after the procedure. This is normal. HOME CARE INSTRUCTIONS  Only take over-the-counter or prescription medicine as directed by your health care provider.  Do not douche, use tampons, or  have sexual intercourse until your health care provider approves.  Follow your health care provider's instructions regarding any activity restrictions, such as strenuous exercise or heavy lifting. SEEK MEDICAL CARE IF:  You have heavy bleeding or bleeding longer than 2 days after the procedure.  You have bad smelling drainage from your vagina.  You have a fever and chills.  Youhave severe lower stomach (abdominal) pain. SEEK IMMEDIATE MEDICAL CARE IF:  You have severe cramps in your stomach or back.  You pass large blood clots.  Your bleeding increases.  You become weak or lightheaded, or you pass out.   This information is not intended to replace advice given to you by your health care provider. Make sure you discuss any questions you have with your health care provider.   Document Released: 05/27/2013 Document Reviewed: 05/27/2013 Elsevier Interactive Patient Education 2016 [employer]. CA-125 Tumor Marker Test WHY AM I HAVING THIS TEST? This test is used to check the level of cancer antigen 125 (CA-125) in your blood. The CA-125 tumor marker test can be helpful in detecting ovarian cancer. The test is only performed if you are considered at high risk for ovarian cancer. Your health care provider may recommend this test if:  You have a strong family history of ovarian cancer.  You have a breast cancer antigen (BRCA) genetic defect. If you have already been diagnosed with ovarian cancer, your health care provider may use this test to help identify the extent of the disease and to monitor your response to treatment. WHAT KIND OF SAMPLE IS TAKEN? A blood sample is required for this test. It is usually collected by inserting a needle into a vein. HOW DO I PREPARE FOR THE TEST? There is no preparation required for this test. WHAT ARE THE REFERENCE RANGES? Reference ranges are considered healthy ranges established after testing a large group of healthy people. Reference ranges  may vary among different people, labs, and hospitals. It is your responsibility to obtain your test results. Ask the lab or department performing the test when and how you will get your results. The reference range for this test is 0-35 units/mL or less than 35 kunits/L (SI units). WHAT DO THE RESULTS MEAN? Increased levels of CA-125 may indicate:  Certain types of cancer, including:  Ovarian cancer.  Pancreatic cancer.  Colon cancer.  Lung cancer.  Breast cancer.  Lymphoma.  Noncancerous (benign) disorders, including:  Cirrhosis.  Pregnancy.  Endometriosis.  Pancreatitis.  Pelvic inflammatory disease (PID). Talk with your health care provider to discuss your results, treatment options, and if necessary, the need for more tests. Talk with your health care provider if you have any questions about your results.   This information is not intended to replace advice given to you by your health care provider. Make sure you discuss any questions you have with your health care provider.   Document Released: 08/28/2004 Document Revised: 08/27/2014 Document Reviewed: 12/24/2013 Elsevier Interactive Patient Education 2016 Elsevier Inc. Ovarian Cyst An ovarian cyst is a fluid-filled sac that forms on an ovary. The ovaries are small organs that produce eggs in women. Various types of cysts can form on the ovaries.  Most are not cancerous. Many do not cause problems, and they often go away on their own. Some may cause symptoms and require treatment. Common types of ovarian cysts include:  Functional cysts--These cysts may occur every month during the menstrual cycle. This is normal. The cysts usually go away with the next menstrual cycle if the woman does not get pregnant. Usually, there are no symptoms with a functional cyst.  Endometrioma cysts--These cysts form from the tissue that lines the uterus. They are also called "chocolate cysts" because they become filled with blood that turns  brown. This type of cyst can cause pain in the lower abdomen during intercourse and with your menstrual period.  Cystadenoma cysts--This type develops from the cells on the outside of the ovary. These cysts can get very big and cause lower abdomen pain and pain with intercourse. This type of cyst can twist on itself, cut off its blood supply, and cause severe pain. It can also easily rupture and cause a lot of pain.  Dermoid cysts--This type of cyst is sometimes found in both ovaries. These cysts may contain different kinds of body tissue, such as skin, teeth, hair, or cartilage. They usually do not cause symptoms unless they get very big.  Theca lutein cysts--These cysts occur when too much of a certain hormone (human chorionic gonadotropin) is produced and overstimulates the ovaries to produce an egg. This is most common after procedures used to assist with the conception of a baby (in vitro fertilization). CAUSES   Fertility drugs can cause a condition in which multiple large cysts are formed on the ovaries. This is called ovarian hyperstimulation syndrome.  A condition called polycystic ovary syndrome can cause hormonal imbalances that can lead to nonfunctional ovarian cysts. SIGNS AND SYMPTOMS  Many ovarian cysts do not cause symptoms. If symptoms are present, they may include:  Pelvic pain or pressure.  Pain in the lower abdomen.  Pain during sexual intercourse.  Increasing girth (swelling) of the abdomen.  Abnormal menstrual periods.  Increasing pain with menstrual periods.  Stopping having menstrual periods without being pregnant. DIAGNOSIS  These cysts are commonly found during a routine or annual pelvic exam. Tests may be ordered to find out more about the cyst. These tests may include:  Ultrasound.  X-ray of the pelvis.  CT scan.  MRI.  Blood tests. TREATMENT  Many ovarian cysts go away on their own without treatment. Your health care provider may want to check  your cyst regularly for 2-3 months to see if it changes. For women in menopause, it is particularly important to monitor a cyst closely because of the higher rate of ovarian cancer in menopausal women. When treatment is needed, it may include any of the following:  A procedure to drain the cyst (aspiration). This may be done using a long needle and ultrasound. It can also be done through a laparoscopic procedure. This involves using a thin, lighted tube with a tiny camera on the end (laparoscope) inserted through a small incision.  Surgery to remove the whole cyst. This may be done using laparoscopic surgery or an open surgery involving a larger incision in the lower abdomen.  Hormone treatment or birth control pills. These methods are sometimes used to help dissolve a cyst. HOME CARE INSTRUCTIONS   Only take over-the-counter or prescription medicines as directed by your health care provider.  Follow up with your health care provider as directed.  Get regular pelvic exams and Pap tests. SEEK MEDICAL CARE IF:  Your periods are late, irregular, or painful, or they stop.  Your pelvic pain or abdominal pain does not go away.  Your abdomen becomes larger or swollen.  You have pressure on your bladder or trouble emptying your bladder completely.  You have pain during sexual intercourse.  You have feelings of fullness, pressure, or discomfort in your stomach.  You lose weight for no apparent reason.  You feel generally ill.  You become constipated.  You lose your appetite.  You develop acne.  You have an increase in body and facial hair.  You are gaining weight, without changing your exercise and eating habits.  You think you are pregnant. SEEK IMMEDIATE MEDICAL CARE IF:   You have increasing abdominal pain.  You feel sick to your stomach (nauseous), and you throw up (vomit).  You develop a fever that comes on suddenly.  You have abdominal pain during a bowel  movement.  Your menstrual periods become heavier than usual. MAKE SURE YOU:  Understand these instructions.  Will watch your condition.  Will get help right away if you are not doing well or get worse.   This information is not intended to replace advice given to you by your health care provider. Make sure you discuss any questions you have with your health care provider.   Document Released: 08/06/2005 Document Revised: 08/11/2013 Document Reviewed: 04/13/2013 Elsevier Interactive Patient Education [employer].

## 2015-12-22 NOTE — Progress Notes (Signed)
   HPI: Is a 45 year old gravida 3 para 2 AB 1 presented to the office today with several month history of right lower quadrant pain on-and-off forceps the past week. She denied any fever, chills, vomiting but some nausea at least once a day. She has no change in appetite. No GU or GI complaints. She was complaining of vaginal discharge on and off for several months. She is not sexually active. She is reporting normal menstrual cycle. She is long overdue for gynecological exam. She did have a normal colonoscopy with the exception of hemorrhoids last here. Patient reports normal menstrual cycles.  ROS: A ROS was performed and pertinent positives and negatives are included in the history.  GENERAL: No fevers or chills. HEENT: No change in vision, no earache, sore throat or sinus congestion. NECK: No pain or stiffness. CARDIOVASCULAR: No chest pain or pressure. No palpitations. PULMONARY: No shortness of breath, cough or wheeze. GASTROINTESTINAL: Right lower abdominal pains, nausea without vomiting  no diarrhea, melena or bright red blood per rectum. GENITOURINARY: No urinary frequency, urgency, hesitancy or dysuria. Vaginal discharge MUSCULOSKELETAL: No joint or muscle pain, no back pain, no recent trauma. DERMATOLOGIC: No rash, no itching, no lesions. ENDOCRINE: No polyuria, polydipsia, no heat or cold intolerance. No recent change in weight. HEMATOLOGICAL: No anemia or easy bruising or bleeding. NEUROLOGIC: No headache, seizures, numbness, tingling or weakness. PSYCHIATRIC: No depression, no loss of interest in normal activity or change in sleep pattern.   PE: Blood pressure 130/86 Gen. appearance well-developed well-nourished female with the above-mentioned complaining Back: No CVA tenderness Abdomen: Soft nontender no rebound or guarding Pelvic: Bartholin urethra Skene was within normal limits Vagina: No lesions or discharge wet prep obtained Cervix: No lesions or discharge Uterus: Anteverted normal  size shape and consistency nontender Adnexa: No palpable masses or tenderness Rectal exam not done  Wet prep: Few white blood cell few bacteria are otherwise normal  Urinalysis: WBC 0-3, RBC 0-2, few bacteria essentially normal  Ultrasound: Uterus measured 9.7 x 6.1 x 4.8 cm with endometrial stripe of 10.9 mm left intramural fibroid 17 x 17 mm was noted and endometrial focus measuring 11 x 6 mm was noted endometrium try layer. A right ovarian thinwall cystic mass with internal low level echoes measuring 27 x 24 x 20 mm with positive color flow increased the periphery. Left ovary with microcalcification no fluid in the cul-de-sac  Patient was counseled for an endometrial biopsy. Cervix was cleansed with Betadine solution. A sterile Pipelle was introduced into the uterine cavity and moderate amount of tissue was obtained was given for histological evaluation. Of note several years ago patient had a tubal ligation.   Assessment Plan: Patient with several months history of right lower quadrant discomfort. Right ovarian cyst was seen. A CA 125 will be drawn today. Patient will receive Depo-Provera 150 mg IM today to help resolve the cyst. She'll return back in 3 months for follow-up ultrasound. Also patient incidentally was found to have an endometrial lesion possibly a polyp which may be contributing to her discharge and she experiences on and off at times and for this reason an endometrial biopsy was obtained and she'll return back to the office next week for sonohysterogram.    Greater than 50% of time was spent in counseling and coordinating care of this patient. Time of consultation:  25 Minutes.

## 2015-12-23 ENCOUNTER — Telehealth: Payer: Self-pay | Admitting: Gynecology

## 2015-12-23 LAB — CA 125: CA 125: 12 U/mL (ref <35)

## 2015-12-23 NOTE — Addendum Note (Signed)
Addended by: [name] on: 12/23/2015 02:24 PM   Modules accepted: Level of Service

## 2015-12-23 NOTE — Telephone Encounter (Signed)
12/23/15-I LM VM for pt to let her know that she has a 20% coins due for sonohysterogram as she has met her $500 deductible. I based the amount on the [phone] and the [phone] as JF already did the [phone] and [phone] on 12/22/15. Pt resp is $121.50. Per Samantha'@BC'$ [id number]

## 2015-12-24 LAB — URINE CULTURE
COLONY COUNT: NO GROWTH
Organism ID, Bacteria: NO GROWTH

## 2015-12-26 ENCOUNTER — Ambulatory Visit (INDEPENDENT_AMBULATORY_CARE_PROVIDER_SITE_OTHER): Payer: BLUE CROSS/BLUE SHIELD | Admitting: Gynecology

## 2015-12-26 ENCOUNTER — Ambulatory Visit: Payer: BLUE CROSS/BLUE SHIELD | Admitting: Gynecology

## 2015-12-26 ENCOUNTER — Encounter: Payer: Self-pay | Admitting: Gynecology

## 2015-12-26 ENCOUNTER — Ambulatory Visit (INDEPENDENT_AMBULATORY_CARE_PROVIDER_SITE_OTHER): Payer: BLUE CROSS/BLUE SHIELD

## 2015-12-26 ENCOUNTER — Other Ambulatory Visit: Payer: BLUE CROSS/BLUE SHIELD

## 2015-12-26 DIAGNOSIS — R102 Pelvic and perineal pain: Secondary | ICD-10-CM

## 2015-12-26 DIAGNOSIS — N840 Polyp of corpus uteri: Secondary | ICD-10-CM | POA: Insufficient documentation

## 2015-12-26 DIAGNOSIS — N83201 Unspecified ovarian cyst, right side: Secondary | ICD-10-CM | POA: Insufficient documentation

## 2015-12-26 DIAGNOSIS — N9489 Other specified conditions associated with female genital organs and menstrual cycle: Secondary | ICD-10-CM

## 2015-12-26 NOTE — Progress Notes (Signed)
   Patient presented to the office today for sonohysterogram as part of her evaluation for an incidental finding at time of the ultrasound that was done in the office on May 4 as a result of patient's right lower quadrant pain. She was found to have a right ovarian cyst but a questionable intrauterine defect. Her ultrasound that they demonstrated the following:  Ultrasound: Uterus measured 9.7 x 6.1 x 4.8 cm with endometrial stripe of 10.9 mm left intramural fibroid 17 x 17 mm was noted and endometrial focus measuring 11 x 6 mm was noted endometrium try layer. A right ovarian thinwall cystic mass with internal low level echoes measuring 27 x 24 x 20 mm with positive color flow increased the periphery. Left ovary with microcalcification no fluid in the cul-de-sac  An endometrial biopsy was done that day pathology report pending at time of this dictation. She did have a normal CA 125. Today's sonohysterogram demonstrated the following:  The cervix was cleansed with Betadine solution. A sterile Pipelle was introduced into the uterine cavity. A left posterior wall defect measuring 16 x 8 x 17 mm defect was seen.  Assessment/plan: Patient with endometrial polyp will be scheduled for an outpatient procedure for resectoscopic polypectomy. She did receive Depo-Provera 150 mg at time of last office visit on May 4 as a result of the right ovarian cyst and has a follow-up ultrasound in 3 months to make sure there is coming complete resolution of this cyst. She did have a normal CA 125. Literature information was provided on resectoscopic surgery and we'll schedule accordingly. The watery discharge as she may have been experiencing the past maybe attributed to this endometrial polyp. She will need a Pap smear at time of her preop examination.

## 2015-12-29 ENCOUNTER — Encounter: Payer: Self-pay | Admitting: Gynecology

## 2016-01-05 NOTE — Patient Instructions (Addendum)
Your procedure is scheduled on:  Tuesday, Jan 17, 2016  Enter through the Main Entrance of [facility] at:  9:00 AM  Pick up the phone at the desk and dial [phone].  Call this number if you have problems the morning of surgery: [phone].  Remember: Do NOT eat food or drink after:  Midnight Monday, Jan 16, 2016  Take these medicines the morning of surgery with a SIP OF WATER:  Hydrochlorothiazide, Protonix, Potassium  Do NOT wear jewelry (body piercing), metal hair clips/bobby pins, make-up, or nail polish. Do NOT wear lotions, powders, or perfumes.  You may wear deodorant. Do NOT shave for 48 hours prior to surgery. Do NOT bring valuables to the hospital. Contacts, dentures, or bridgework may not be worn into surgery.  Have a responsible adult drive you home and stay with you for 24 hours after your procedure

## 2016-01-06 ENCOUNTER — Encounter (HOSPITAL_COMMUNITY): Payer: Self-pay

## 2016-01-06 ENCOUNTER — Other Ambulatory Visit: Payer: Self-pay

## 2016-01-06 ENCOUNTER — Encounter (HOSPITAL_COMMUNITY)
Admission: RE | Admit: 2016-01-06 | Discharge: 2016-01-06 | Disposition: A | Discharge status: Home / Self Care | Payer: BLUE CROSS/BLUE SHIELD | Source: Ambulatory Visit | Attending: Gynecology | Admitting: Gynecology

## 2016-01-06 DIAGNOSIS — Z01810 Encounter for preprocedural cardiovascular examination: Secondary | ICD-10-CM | POA: Insufficient documentation

## 2016-01-06 DIAGNOSIS — R1031 Right lower quadrant pain: Secondary | ICD-10-CM | POA: Insufficient documentation

## 2016-01-06 DIAGNOSIS — Z01812 Encounter for preprocedural laboratory examination: Secondary | ICD-10-CM | POA: Diagnosis present

## 2016-01-06 DIAGNOSIS — N83201 Unspecified ovarian cyst, right side: Secondary | ICD-10-CM | POA: Insufficient documentation

## 2016-01-06 HISTORY — DX: Unspecified osteoarthritis, unspecified site: M19.90

## 2016-01-06 HISTORY — DX: Anxiety disorder, unspecified: F41.9

## 2016-01-06 LAB — CBC
HEMATOCRIT: 39.3 % (ref 36.0–46.0)
Hemoglobin: 14.5 g/dL (ref 12.0–15.0)
MCH: 33.4 pg (ref 26.0–34.0)
MCHC: 36.9 g/dL — AB (ref 30.0–36.0)
MCV: 90.6 fL (ref 78.0–100.0)
Platelets: 264 10*3/uL (ref 150–400)
RBC: 4.34 MIL/uL (ref 3.87–5.11)
RDW: 14.6 % (ref 11.5–15.5)
WBC: 7.9 10*3/uL (ref 4.0–10.5)

## 2016-01-06 LAB — BASIC METABOLIC PANEL
Anion gap: 8 (ref 5–15)
BUN: 11 mg/dL (ref 6–20)
CALCIUM: 9.4 mg/dL (ref 8.9–10.3)
CO2: 26 mmol/L (ref 22–32)
Chloride: 103 mmol/L (ref 101–111)
Creatinine, Ser: 0.77 mg/dL (ref 0.44–1.00)
GFR calc Af Amer: >60 mL/min (ref >60)
GLUCOSE: 94 mg/dL (ref 65–99)
Potassium: 3.3 mmol/L — ABNORMAL LOW (ref 3.5–5.1)
Sodium: 137 mmol/L (ref 135–145)

## 2016-01-06 NOTE — Pre-Procedure Instructions (Signed)
Dr. Hollis reviewed EKG, no new orders received at this time. 

## 2016-01-13 NOTE — H&P (Signed)
@LOGO @  Ashley Bailey is an 45 y.o. female who is scheduled for resectoscopic polypectomy next week. Her history from the time of office visit on May 8 demonstrated the following:  "Patient presented to the office today for sonohysterogram as part of her evaluation for an incidental finding at time of the ultrasound that was done in the office on May 4 as a result of patient's right lower quadrant pain. She was found to have a right ovarian cyst but a questionable intrauterine defect. Her ultrasound that they demonstrated the following:  Ultrasound: Uterus measured 9.7 x 6.1 x 4.8 cm with endometrial stripe of 10.9 mm left intramural fibroid 17 x 17 mm was noted and endometrial focus measuring 11 x 6 mm was noted endometrium try layer. A right ovarian thinwall cystic mass with internal low level echoes measuring 27 x 24 x 20 mm with positive color flow increased the periphery. Left ovary with microcalcification no fluid in the cul-de-sac  An endometrial biopsy was done that day pathology report pending at time of this dictation. She did have a normal CA 125. Today's sonohysterogram demonstrated the following:  The cervix was cleansed with Betadine solution. A sterile Pipelle was introduced into the uterine cavity. A left posterior wall defect measuring 16 x 8 x 17 mm defect was seen."  Patient with endometrial polyp will be scheduled for an outpatient procedure for resectoscopic polypectomy. She did receive Depo-Provera 150 mg at time of last office visit on May 4 as a result of the right ovarian cyst and has a follow-up ultrasound in 3 months to make sure there is coming complete resolution of this cyst. She did have a normal CA 125.  Pertinent Gynecological History: Menses: Normal Bleeding: Normal Contraception: tubal ligation DES exposure: denies Blood transfusions: none Sexually transmitted diseases: no past history Previous GYN Procedures: C-section tubal ligation  Last mammogram:  normal Date: 2015 Last pap: Normal but positive HPV but when tested for septi 16, 18 and 45 it was negative Date: 26 OB History: G 3, P 2 A 1   Menstrual History: Menarche age: 36 Patient's last menstrual period was 11/30/2015.    Past Medical History  Diagnosis Date  . IBS (irritable bowel syndrome)   . Seasonal allergies   . Edema   . Anxiety   . Arthritis     wrist, knee    Past Surgical History  Procedure Laterality Date  . Cesarean section      X2  . Tonsillectomy and adenoidectomy      denies adenoids just tonsillectomy  . Colonoscopy N/A 12/06/2015    Procedure: COLONOSCOPY;  Surgeon: Danie Binder, MD;  Location: [facility];  Service: Endoscopy;  Laterality: N/A;  1145 - moved to 12:00 - office to notify  . Esophagogastroduodenoscopy N/A 12/06/2015    Procedure: ESOPHAGOGASTRODUODENOSCOPY (EGD);  Surgeon: Danie Binder, MD;  Location: [facility];  Service: Endoscopy;  Laterality: N/A;  . Tonsillectomy    . Tubal ligation      Family History  Problem Relation Age of Onset  . Hypertension Mother   . Diabetes Sister   . Colon cancer Neg Hx     Social History:  reports that she has been smoking Cigarettes.  She has a 5 pack-year smoking history. She has never used smokeless tobacco. She reports that she drinks alcohol. She reports that she does not use illicit drugs.  Allergies:  Allergies  Allergen Reactions  . Shrimp [Shellfish Allergy] Itching  Itching when eating shrimp. Patient does eat shrimp.     No prescriptions prior to admission    REVIEW OF SYSTEMS: A ROS was performed and pertinent positives and negatives are included in the history.  GENERAL: No fevers or chills. HEENT: No change in vision, no earache, sore throat or sinus congestion. NECK: No pain or stiffness. CARDIOVASCULAR: No chest pain or pressure. No palpitations. PULMONARY: No shortness of breath, cough or wheeze. GASTROINTESTINAL: No abdominal pain, nausea, vomiting or diarrhea,  melena or bright red blood per rectum. GENITOURINARY: No urinary frequency, urgency, hesitancy or dysuria. MUSCULOSKELETAL: No joint or muscle pain, no back pain, no recent trauma. DERMATOLOGIC: No rash, no itching, no lesions. ENDOCRINE: No polyuria, polydipsia, no heat or cold intolerance. No recent change in weight. HEMATOLOGICAL: No anemia or easy bruising or bleeding. NEUROLOGIC: No headache, seizures, numbness, tingling or weakness. PSYCHIATRIC: No depression, no loss of interest in normal activity or change in sleep pattern.     Last menstrual period 11/30/2015.  Physical Exam:  HEENT:unremarkable Neck:Supple, midline, no thyroid megaly, no carotid bruits Lungs:  Clear to auscultation no rhonchi's or wheezes Heart:Regular rate and rhythm, no murmurs or gallops Breast Exam: Not done Abdomen: Soft nontender no rebound or guarding Pelvic:BUS within normal limits Vagina: No lesions or discharge Cervix: No lesions or discharge Uterus: Anteverted normal size shape and consistency Adnexa: See above Extremities: No cords, no edema Rectal: Not done  Assessment/Plan: Patient with clinical evidence of endometrial polyp scheduled to undergo resectoscopic polypectomy. The following risks were discussed with the patient reference to her surgery:                      Patient was counseled as to the risk of surgery to include the following:  1. Infection (prohylactic antibiotics will be administered)  2. DVT/Pulmonary Embolism (prophylactic pneumo compression stockings will be used)  3.Trauma to internal organs requiring additional surgical procedure to repair any injury to     Internal organs requiring perhaps additional hospitalization days.  4.Hemmorhage requiring transfusion and blood products which carry risks such as             anaphylactic reaction, hepatitis and AIDS  Patient had received literature information on the procedure scheduled and all her questions were answered and fully  accepts all risk.   [facility] HMD2:22 PMTD     Esai Stecklein H 01/13/2016, 1:59 PM

## 2016-01-16 MED ORDER — DEXTROSE 5 % IV SOLN
2.0000 g | INTRAVENOUS | Status: AC
  Administered 2016-01-17: 2 g via INTRAVENOUS
  Filled 2016-01-16: qty 2

## 2016-01-17 ENCOUNTER — Ambulatory Visit (HOSPITAL_COMMUNITY): Payer: BLUE CROSS/BLUE SHIELD | Admitting: Certified Registered Nurse Anesthetist

## 2016-01-17 ENCOUNTER — Ambulatory Visit (HOSPITAL_COMMUNITY)
Admission: RE | Admit: 2016-01-17 | Discharge: 2016-01-17 | Disposition: A | Discharge status: Home / Self Care | Payer: BLUE CROSS/BLUE SHIELD | Source: Ambulatory Visit | Attending: Gynecology | Admitting: Gynecology

## 2016-01-17 ENCOUNTER — Encounter (HOSPITAL_COMMUNITY): Payer: Self-pay | Admitting: Emergency Medicine

## 2016-01-17 ENCOUNTER — Encounter (HOSPITAL_COMMUNITY)
Admission: RE | Disposition: A | Discharge status: Home / Self Care | Payer: Self-pay | Source: Ambulatory Visit | Attending: Gynecology

## 2016-01-17 DIAGNOSIS — D251 Intramural leiomyoma of uterus: Secondary | ICD-10-CM | POA: Diagnosis not present

## 2016-01-17 DIAGNOSIS — F17210 Nicotine dependence, cigarettes, uncomplicated: Secondary | ICD-10-CM | POA: Diagnosis not present

## 2016-01-17 DIAGNOSIS — N83201 Unspecified ovarian cyst, right side: Secondary | ICD-10-CM | POA: Diagnosis not present

## 2016-01-17 DIAGNOSIS — N840 Polyp of corpus uteri: Secondary | ICD-10-CM | POA: Insufficient documentation

## 2016-01-17 DIAGNOSIS — K219 Gastro-esophageal reflux disease without esophagitis: Secondary | ICD-10-CM | POA: Insufficient documentation

## 2016-01-17 HISTORY — PX: HYSTEROSCOPY WITH RESECTOSCOPE: SHX5395

## 2016-01-17 LAB — PREGNANCY, URINE: PREG TEST UR: NEGATIVE

## 2016-01-17 SURGERY — HYSTEROSCOPY, USING RESECTOSCOPE
Anesthesia: General | Site: Uterus

## 2016-01-17 MED ORDER — FENTANYL CITRATE (PF) 100 MCG/2ML IJ SOLN
INTRAMUSCULAR | Status: DC | PRN
  Administered 2016-01-17: 25 ug via INTRAVENOUS
  Administered 2016-01-17: 50 ug via INTRAVENOUS
  Administered 2016-01-17: 25 ug via INTRAVENOUS

## 2016-01-17 MED ORDER — SCOPOLAMINE 1 MG/3DAYS TD PT72
1.0000 | MEDICATED_PATCH | Freq: Once | TRANSDERMAL | Status: DC
  Administered 2016-01-17: 1.5 mg via TRANSDERMAL

## 2016-01-17 MED ORDER — ONDANSETRON HCL 4 MG/2ML IJ SOLN
INTRAMUSCULAR | Status: DC | PRN
  Administered 2016-01-17: 4 mg via INTRAVENOUS

## 2016-01-17 MED ORDER — EPHEDRINE SULFATE 50 MG/ML IJ SOLN
INTRAMUSCULAR | Status: DC | PRN
  Administered 2016-01-17 (×2): 5 mg via INTRAVENOUS

## 2016-01-17 MED ORDER — LACTATED RINGERS IV SOLN
INTRAVENOUS | Status: DC
  Administered 2016-01-17: 10:00:00 via INTRAVENOUS

## 2016-01-17 MED ORDER — ONDANSETRON HCL 4 MG/2ML IJ SOLN
INTRAMUSCULAR | Status: AC
  Filled 2016-01-17: qty 2

## 2016-01-17 MED ORDER — FENTANYL CITRATE (PF) 100 MCG/2ML IJ SOLN
INTRAMUSCULAR | Status: AC
Start: 2016-01-17 — End: 2016-01-17
  Filled 2016-01-17: qty 2

## 2016-01-17 MED ORDER — PROPOFOL 10 MG/ML IV BOLUS
INTRAVENOUS | Status: AC
  Filled 2016-01-17: qty 20

## 2016-01-17 MED ORDER — LACTATED RINGERS IV SOLN
INTRAVENOUS | Status: DC
  Administered 2016-01-17: 1000 mL via INTRAVENOUS

## 2016-01-17 MED ORDER — SODIUM CHLORIDE 0.9 % IR SOLN
Status: DC | PRN
  Administered 2016-01-17: 3000 mL

## 2016-01-17 MED ORDER — LIDOCAINE HCL (CARDIAC) 20 MG/ML IV SOLN
INTRAVENOUS | Status: AC
Start: 2016-01-17 — End: 2016-01-17
  Filled 2016-01-17: qty 5

## 2016-01-17 MED ORDER — LIDOCAINE HCL (CARDIAC) 20 MG/ML IV SOLN
INTRAVENOUS | Status: DC | PRN
  Administered 2016-01-17: 100 mg via INTRAVENOUS

## 2016-01-17 MED ORDER — VASOPRESSIN 20 UNIT/ML IV SOLN
INTRAVENOUS | Status: AC
  Filled 2016-01-17: qty 1

## 2016-01-17 MED ORDER — KETOROLAC TROMETHAMINE 30 MG/ML IJ SOLN
INTRAMUSCULAR | Status: DC | PRN
  Administered 2016-01-17: 30 mg via INTRAVENOUS

## 2016-01-17 MED ORDER — PROPOFOL 10 MG/ML IV BOLUS
INTRAVENOUS | Status: DC | PRN
  Administered 2016-01-17: 200 mg via INTRAVENOUS

## 2016-01-17 MED ORDER — KETOROLAC TROMETHAMINE 30 MG/ML IJ SOLN
INTRAMUSCULAR | Status: AC
  Filled 2016-01-17: qty 1

## 2016-01-17 MED ORDER — DEXAMETHASONE SODIUM PHOSPHATE 10 MG/ML IJ SOLN
INTRAMUSCULAR | Status: DC | PRN
  Administered 2016-01-17: 4 mg via INTRAVENOUS

## 2016-01-17 MED ORDER — VASOPRESSIN 20 UNIT/ML IV SOLN
INTRAVENOUS | Status: DC | PRN
Start: 2016-01-17 — End: 2016-01-17
  Administered 2016-01-17: 10 [IU]

## 2016-01-17 MED ORDER — FENTANYL CITRATE (PF) 100 MCG/2ML IJ SOLN
25.0000 ug | INTRAMUSCULAR | Status: DC | PRN
  Administered 2016-01-17 (×2): 50 ug via INTRAVENOUS

## 2016-01-17 MED ORDER — MIDAZOLAM HCL 2 MG/2ML IJ SOLN
INTRAMUSCULAR | Status: DC | PRN
  Administered 2016-01-17: 2 mg via INTRAVENOUS

## 2016-01-17 MED ORDER — DEXAMETHASONE SODIUM PHOSPHATE 4 MG/ML IJ SOLN
INTRAMUSCULAR | Status: AC
  Filled 2016-01-17: qty 1

## 2016-01-17 MED ORDER — FENTANYL CITRATE (PF) 100 MCG/2ML IJ SOLN
INTRAMUSCULAR | Status: AC
  Filled 2016-01-17: qty 2

## 2016-01-17 MED ORDER — SCOPOLAMINE 1 MG/3DAYS TD PT72
MEDICATED_PATCH | TRANSDERMAL | Status: AC
  Administered 2016-01-17: 1.5 mg via TRANSDERMAL
  Filled 2016-01-17: qty 1

## 2016-01-17 MED ORDER — SODIUM CHLORIDE 0.9 % IJ SOLN
INTRAMUSCULAR | Status: AC
  Filled 2016-01-17: qty 50

## 2016-01-17 MED ORDER — MIDAZOLAM HCL 2 MG/2ML IJ SOLN
INTRAMUSCULAR | Status: AC
  Filled 2016-01-17: qty 2

## 2016-01-17 SURGICAL SUPPLY — 24 items
CANISTERS HI-FLOW 3000CC (CANNISTER) ×3 IMPLANT
CATH FOLEY 2WAY SLVR 30CC 16FR (CATHETERS) IMPLANT
CATH ROBINSON RED A/P 16FR (CATHETERS) ×3 IMPLANT
CLOTH BEACON ORANGE TIMEOUT ST (SAFETY) ×3 IMPLANT
CONTAINER PREFILL 10% NBF 60ML (FORM) ×3 IMPLANT
DEVICE MYOSURE LITE (MISCELLANEOUS) ×3 IMPLANT
DEVICE MYOSURE REACH (MISCELLANEOUS) IMPLANT
FILTER ARTHROSCOPY CONVERTOR (FILTER) ×3 IMPLANT
GLOVE BIOGEL PI IND STRL 7.0 (GLOVE) ×2 IMPLANT
GLOVE BIOGEL PI IND STRL 8 (GLOVE) ×2 IMPLANT
GLOVE BIOGEL PI INDICATOR 7.0 (GLOVE) ×1
GLOVE BIOGEL PI INDICATOR 8 (GLOVE) ×1
GLOVE ECLIPSE 7.5 STRL STRAW (GLOVE) ×6 IMPLANT
GOWN STRL REUS W/TWL LRG LVL3 (GOWN DISPOSABLE) ×6 IMPLANT
PACK VAGINAL MINOR WOMEN LF (CUSTOM PROCEDURE TRAY) ×3 IMPLANT
PAD OB MATERNITY 4.3X12.25 (PERSONAL CARE ITEMS) ×3 IMPLANT
PAD PREP 24X48 CUFFED NSTRL (MISCELLANEOUS) ×3 IMPLANT
PLUG CATH AND CAP STER (CATHETERS) IMPLANT
SEAL ROD LENS SCOPE MYOSURE (ABLATOR) ×3 IMPLANT
SYR 30ML LL (SYRINGE) IMPLANT
TOWEL OR 17X24 6PK STRL BLUE (TOWEL DISPOSABLE) ×6 IMPLANT
TUBING AQUILEX INFLOW (TUBING) ×3 IMPLANT
TUBING AQUILEX OUTFLOW (TUBING) ×3 IMPLANT
WATER STERILE IRR 1000ML POUR (IV SOLUTION) ×3 IMPLANT

## 2016-01-17 NOTE — Anesthesia Procedure Notes (Signed)
Procedure Name: LMA Insertion Date/Time: 01/17/2016 10:24 AM Performed by: Hewitt Blade Pre-anesthesia Checklist: Patient identified, Patient being monitored, Emergency Drugs available and Suction available Patient Re-evaluated:Patient Re-evaluated prior to inductionOxygen Delivery Method: Circle system utilized Preoxygenation: Pre-oxygenation with 100% oxygen Intubation Type: IV induction LMA: LMA inserted LMA Size: 4.0 Number of attempts: 1 Placement Confirmation: positive ETCO2 and breath sounds checked- equal and bilateral Tube secured with: Tape Dental Injury: Teeth and Oropharynx as per pre-operative assessment

## 2016-01-17 NOTE — Transfer of Care (Signed)
Immediate Anesthesia Transfer of Care Note  Patient: Ashley Bailey  Procedure(s) Performed: Procedure(s): HYSTEROSCOPY WITH RESECTOSCOPIC POLYPECTOMY WITH MYOSURE (N/A)  Patient Location: PACU  Anesthesia Type:General  Level of Consciousness: awake, alert  and oriented  Airway & Oxygen Therapy: Patient Spontanous Breathing and Patient connected to nasal cannula oxygen  Post-op Assessment: Report given to RN, Post -op Vital signs reviewed and stable and Patient moving all extremities  Post vital signs: Reviewed and stable  Last Vitals:  Filed Vitals:   01/17/16 0918  BP: 140/97  Pulse: 90  Temp: 36.7 C  Resp: 16    Last Pain: There were no vitals filed for this visit.    Patients Stated Pain Goal: 3 ([id number])  Complications: No apparent anesthesia complications

## 2016-01-17 NOTE — Anesthesia Preprocedure Evaluation (Signed)
Anesthesia Evaluation  Patient identified by MRN, date of birth, ID band Patient awake    Reviewed: Allergy & Precautions, H&P , NPO status , Patient's Chart, lab work & pertinent test results  Airway Mallampati: II  TM Distance: >3 FB Neck ROM: full    Dental no notable dental hx. (+) Dental Advisory Given, Teeth Intact   Pulmonary neg pulmonary ROS, Current Smoker,    Pulmonary exam normal breath sounds clear to auscultation       Cardiovascular Exercise Tolerance: Good negative cardio ROS Normal cardiovascular exam Rhythm:regular Rate:Normal     Neuro/Psych negative neurological ROS  negative psych ROS   GI/Hepatic negative GI ROS, Neg liver ROS, GERD  Medicated and Controlled,  Endo/Other  negative endocrine ROS  Renal/GU negative Renal ROS  negative genitourinary   Musculoskeletal   Abdominal   Peds  Hematology negative hematology ROS (+)   Anesthesia Other Findings   Reproductive/Obstetrics negative OB ROS                             Anesthesia Physical Anesthesia Plan  ASA: II  Anesthesia Plan: General   Post-op Pain Management:    Induction: Intravenous  Airway Management Planned: LMA  Additional Equipment:   Intra-op Plan:   Post-operative Plan:   Informed Consent: I have reviewed the patients History and Physical, chart, labs and discussed the procedure including the risks, benefits and alternatives for the proposed anesthesia with the patient or authorized representative who has indicated his/her understanding and acceptance.   Dental Advisory Given  Plan Discussed with: CRNA and Surgeon  Anesthesia Plan Comments:         Anesthesia Quick Evaluation

## 2016-01-17 NOTE — Op Note (Signed)
   Operative Note  01/17/2016  11:35 AM  PATIENT:  Ashley Bailey  45 y.o. female  PRE-OPERATIVE DIAGNOSIS:  endometrial polyp  POST-OPERATIVE DIAGNOSIS:  endometrial polyp  PROCEDURE:  Procedure(s): HYSTEROSCOPY WITH RESECTOSCOPIC POLYPECTOMY WITH MYOSURE  SURGEON:  Surgeon(s): [name], MD  ANESTHESIA:   general  FINDINGS: Several endometrial polyps of varying sizes. Both tubal os identified cervical canal clear  DESCRIPTION OF OPERATION:FINDINGS: The patient was taken to the operating room where she underwent a successful general endotracheal anesthesia. Patient had PAS stockings for DVT prophylaxis. She received 2 g of Ancef preop. Time out was undertaken to properly identify the patient and the proper operation schedule. The vagina and perineum were prepped and draped in usual sterile fashion. A red rubber Quentin Cornwall was inserted to empty the bladder is content for approximately 50 cc. Bimanual examination demonstrated an anteverted uterus. Patient's legs were in the high lithotomy position. A weighted speculum was placed in the posterior vaginal vault. A single-tooth tenaculum was placed on the anterior cervical lip. 1% lidocaine with 1 100,000 epinephrine was infiltrated into the cervical stroma at the 2, 4, 8 and 10:00 position for a total 10 cc The uterus sounded to 8-1/2 cm. Pratt dilator were used to dilate the cervical canal to a 25 mm size. The Hologic Myosure resectoscopic morcellator with a scope of 6.25 mm and an operating blade of 3.0 mm was introduced into the intrauterine cavity. 0.9% normal saline was the distending media. Inspection of the endometrial cavity demonstrated multiple small uterine polyps located in the lower uterine segment. With the resectoscopic morcellator they were removed and submitted for histological evaluation. Fluid deficit was approximately 100 cc.The single-tooth tenaculum was removed patient was extubated transferred to recovery room  stable vital signs blood loss was minimal. She received 30 mg of Toradol in route to the recovery room.    ESTIMATED BLOOD LOSS: Minimal   Intake/Output Summary (Last 24 hours) at 01/17/16 1135 Last data filed at 01/17/16 1108  Gross per 24 hour  Intake    900 ml  Output     55 ml  Net    845 ml     BLOOD ADMINISTERED:none   LOCAL MEDICATIONS USED:  LIDOCAINE 1% paracervical block total 10 cc  SPECIMEN:  Source of Specimen:  Endometrial polyps  DISPOSITION OF SPECIMEN:  PATHOLOGY  COUNTS:  YES  PLAN OF CARE: Transfer to [place] HMD11:35 AMTD@

## 2016-01-17 NOTE — Anesthesia Postprocedure Evaluation (Signed)
Anesthesia Post Note  Patient: Ashley Bailey  Procedure(s) Performed: Procedure(s) (LRB): HYSTEROSCOPY WITH RESECTOSCOPIC POLYPECTOMY WITH MYOSURE (N/A)  Patient location during evaluation: PACU Anesthesia Type: General Level of consciousness: awake and alert Pain management: pain level controlled Vital Signs Assessment: post-procedure vital signs reviewed and stable Respiratory status: spontaneous breathing, nonlabored ventilation, respiratory function stable and patient connected to nasal cannula oxygen Cardiovascular status: blood pressure returned to baseline and stable Postop Assessment: no signs of nausea or vomiting Anesthetic complications: no     Last Vitals:  Filed Vitals:   01/17/16 1130 01/17/16 1145  BP: 123/82 127/86  Pulse: 78 79  Temp:    Resp: 14 13    Last Pain:  Filed Vitals:   01/17/16 1202  PainSc: 4    Pain Goal: Patients Stated Pain Goal: 3 (01/17/16 1145)               Ildefonso Keaney L

## 2016-01-17 NOTE — Interval H&P Note (Signed)
History and Physical Interval Note:  01/17/2016 9:45 AM  Ashley Bailey  has presented today for surgery, with the diagnosis of endometrial polyp  The various methods of treatment have been discussed with the patient and family. After consideration of risks, benefits and other options for treatment, the patient has consented to  Procedure(s) with comments: [place] (N/A) - polyp as a surgical intervention .  The patient's history has been reviewed, patient examined, no change in status, stable for surgery.  I have reviewed the patient's chart and labs.  Questions were answered to the patient's satisfaction.     [name]

## 2016-01-17 NOTE — Discharge Instructions (Addendum)
Post Anesthesia Home Care Instructions  NO IBUPROFEN PRODUCTS UNTIL: 5PM  Activity: Get plenty of rest for the remainder of the day. A responsible adult should stay with you for 24 hours following the procedure.  For the next 24 hours, DO NOT: -Drive a car -Paediatric nurse -Drink alcoholic beverages -Take any medication unless instructed by your physician -Make any legal decisions or sign important papers.  Meals: Start with liquid foods such as gelatin or soup. Progress to regular foods as tolerated. Avoid greasy, spicy, heavy foods. If nausea and/or vomiting occur, drink only clear liquids until the nausea and/or vomiting subsides. Call your physician if vomiting continues.  Special Instructions/Symptoms: Your throat may feel dry or sore from the anesthesia or the breathing tube placed in your throat during surgery. If this causes discomfort, gargle with warm salt water. The discomfort should disappear within 24 hours.  If you had a scopolamine patch placed behind your ear for the management of post- operative nausea and/or vomiting:  1. The medication in the patch is effective for 72 hours, after which it should be removed.  Wrap patch in a tissue and discard in the trash. Wash hands thoroughly with soap and water. 2. You may remove the patch earlier than 72 hours if you experience unpleasant side effects which may include dry mouth, dizziness or visual disturbances. 3. Avoid touching the patch. Wash your hands with soap and water after contact with the patch.   DISCHARGE INSTRUCTIONS: HYSTEROSCOPY / ENDOMETRIAL ABLATION The following instructions have been prepared to help you care for yourself upon your return home.  May Remove Scop patch on or before: Friday, June 2  May take Ibuprofen after: 5:00 PM TODAY  May take stool softner while taking narcotic pain medication to prevent constipation.  Drink plenty of water.  Personal hygiene:  Use sanitary pads for vaginal  drainage, not tampons.  Shower the day after your procedure.  NO tub baths, pools or Jacuzzis for 2-3 weeks.  Wipe front to back after using the bathroom.  Activity and limitations:  Do NOT drive or operate any equipment for 24 hours. The effects of anesthesia are still present and drowsiness may result.  Do NOT rest in bed all day.  Walking is encouraged.  Walk up and down stairs slowly.  You may resume your normal activity in one to two days or as indicated by your physician. Sexual activity: NO intercourse for at least 2 weeks after the procedure, or as indicated by your Doctor.  Diet: Eat a light meal as desired this evening. You may resume your usual diet tomorrow.  Return to Work: You may resume your work activities in one to two days or as indicated by Marine scientist.  What to expect after your surgery: Expect to have vaginal bleeding/discharge for 2-3 days and spotting for up to 10 days. It is not unusual to have soreness for up to 1-2 weeks. You may have a slight burning sensation when you urinate for the first day. Mild cramps may continue for a couple of days. You may have a regular period in 2-6 weeks.  Call your doctor for any of the following:  Excessive vaginal bleeding or clotting, saturating and changing one pad every hour.  Inability to urinate 6 hours after discharge from hospital.  Pain not relieved by pain medication.  Fever of 100.4 F or greater.  Unusual vaginal discharge or odor.  Return to office _________________Call for an appointment ___________________ Patients signature: ______________________ Nurses signature ________________________  [place] [facility] [phone]

## 2016-01-18 ENCOUNTER — Encounter (HOSPITAL_COMMUNITY): Payer: Self-pay | Admitting: Gynecology

## 2016-01-19 ENCOUNTER — Encounter: Payer: Managed Care, Other (non HMO) | Admitting: Gynecology

## 2016-02-01 ENCOUNTER — Encounter: Payer: Self-pay | Admitting: Gynecology

## 2016-02-01 ENCOUNTER — Ambulatory Visit (INDEPENDENT_AMBULATORY_CARE_PROVIDER_SITE_OTHER): Payer: BLUE CROSS/BLUE SHIELD | Admitting: Gynecology

## 2016-02-01 ENCOUNTER — Other Ambulatory Visit: Payer: Self-pay | Admitting: *Deleted

## 2016-02-01 DIAGNOSIS — Z09 Encounter for follow-up examination after completed treatment for conditions other than malignant neoplasm: Secondary | ICD-10-CM

## 2016-02-01 DIAGNOSIS — N83201 Unspecified ovarian cyst, right side: Secondary | ICD-10-CM

## 2016-02-01 NOTE — Progress Notes (Signed)
   Patient is a 45 year old who presented to the office for her two-week postop visit. Patient status post resectoscopic polypectomy. She stated she did not have a cycle in the month of May. When she was seen in the office back in May she was noted to have a small right ovarian cyst she had a normal CA 125 and was given a shot of Depo-Provera 150 mg IM. She scheduled to follow-up with an ultrasound 3 months. She stated she started spotting right after surgery has had some bleeding which would be around the time that her cycle otherwise started. Patient does have past history of tubal ligation.  Findings from surgery as well as pathology report and pictures were shared with the patient. Pathology report as follows:  Diagnosis Endometrial polyp - ENDOMETRIOID-TYPE POLYP(S) WITH DECIDUALIZATION OF THE STROMA, CONSISTENT WITH HORMONE EFFECT. - FRAGMENTS OF BENIGN SMOOTH MUSCLE. - THERE IS NO EVIDENCE OF HYPERPLASIA OR MALIGNANCY.  Exam: Abdomen: Soft nontender no rebound or guarding Pelvic: Bartholin urethra Skene was within normal limits Vagina: No lesions or discharge Cervix: No lesions or discharge Uterus: Anteverted normal size shape and consistency Adnexa: No palpable masses or tenderness Rectal exam: Not done  Assessment/plan: Patient status post resectoscopic polypectomy. Pathology report benign. Patient scheduled to return back in August for follow-up and a right ovarian cyst.

## 2016-03-21 ENCOUNTER — Encounter: Payer: Self-pay | Admitting: Gynecology

## 2016-03-21 ENCOUNTER — Ambulatory Visit (INDEPENDENT_AMBULATORY_CARE_PROVIDER_SITE_OTHER): Payer: BLUE CROSS/BLUE SHIELD | Admitting: Gynecology

## 2016-03-21 ENCOUNTER — Ambulatory Visit (INDEPENDENT_AMBULATORY_CARE_PROVIDER_SITE_OTHER): Payer: BLUE CROSS/BLUE SHIELD

## 2016-03-21 VITALS — BP 130/82

## 2016-03-21 DIAGNOSIS — Z8742 Personal history of other diseases of the female genital tract: Secondary | ICD-10-CM | POA: Diagnosis not present

## 2016-03-21 DIAGNOSIS — N83201 Unspecified ovarian cyst, right side: Secondary | ICD-10-CM | POA: Diagnosis not present

## 2016-03-21 NOTE — Progress Notes (Signed)
   Patient is a 45 year old who was seen in the office in June of this year for her postoperative exam. Patient was status post resectoscopic polypectomy and has had no further bleeding issues. It was noted that back in May of this year she had a small right ovarian cyst and had a normal CA 125 and had received a shot of Depo-Provera 150 mg IM and is here for follow-up to discuss her ultrasound. She is otherwise asymptomatic.  Ultrasound: Uterus measured 9.3 x 5.5 x 5.1 cm with endometrial stripe of 3.9 mm. No change in fibroid. (Intramural fibroid 17 x 17 mm) Right ovarian follicle 11 x 19 mm. Previous cyst not seen. Left ovary normal. No fluid in the cul-de-sac.  Assessment/plan: Resolution of right ovarian cyst patient scheduled for annual gynecological exam in December this year. She was provided with requisition to schedule her overdue mammogram.  Greater than 50% of the time was spent counseling correlating care for this patient total time 10 minutes

## 2016-04-06 ENCOUNTER — Ambulatory Visit: Payer: BLUE CROSS/BLUE SHIELD | Admitting: Gastroenterology

## 2016-07-23 ENCOUNTER — Other Ambulatory Visit: Payer: Self-pay | Admitting: Gynecology

## 2016-07-23 DIAGNOSIS — Z1231 Encounter for screening mammogram for malignant neoplasm of breast: Secondary | ICD-10-CM

## 2016-07-24 ENCOUNTER — Ambulatory Visit
Admission: RE | Admit: 2016-07-24 | Discharge: 2016-07-24 | Disposition: A | Discharge status: Home / Self Care | Payer: BLUE CROSS/BLUE SHIELD | Source: Ambulatory Visit | Attending: Gynecology | Admitting: Gynecology

## 2016-07-24 DIAGNOSIS — Z1231 Encounter for screening mammogram for malignant neoplasm of breast: Secondary | ICD-10-CM

## 2016-07-25 ENCOUNTER — Other Ambulatory Visit: Payer: Self-pay | Admitting: Gynecology

## 2016-07-25 DIAGNOSIS — R928 Other abnormal and inconclusive findings on diagnostic imaging of breast: Secondary | ICD-10-CM

## 2016-07-26 ENCOUNTER — Telehealth: Payer: Self-pay

## 2016-07-26 ENCOUNTER — Ambulatory Visit (INDEPENDENT_AMBULATORY_CARE_PROVIDER_SITE_OTHER): Payer: BLUE CROSS/BLUE SHIELD | Admitting: Gynecology

## 2016-07-26 ENCOUNTER — Telehealth: Payer: Self-pay | Admitting: *Deleted

## 2016-07-26 ENCOUNTER — Encounter: Payer: Self-pay | Admitting: Gynecology

## 2016-07-26 VITALS — BP 128/86 | Ht 66.0 in | Wt 217.0 lb

## 2016-07-26 DIAGNOSIS — Z1151 Encounter for screening for human papillomavirus (HPV): Secondary | ICD-10-CM | POA: Diagnosis not present

## 2016-07-26 DIAGNOSIS — N951 Menopausal and female climacteric states: Secondary | ICD-10-CM | POA: Diagnosis not present

## 2016-07-26 DIAGNOSIS — Z01411 Encounter for gynecological examination (general) (routine) with abnormal findings: Secondary | ICD-10-CM

## 2016-07-26 DIAGNOSIS — N760 Acute vaginitis: Secondary | ICD-10-CM | POA: Diagnosis not present

## 2016-07-26 DIAGNOSIS — N898 Other specified noninflammatory disorders of vagina: Secondary | ICD-10-CM

## 2016-07-26 DIAGNOSIS — Z113 Encounter for screening for infections with a predominantly sexual mode of transmission: Secondary | ICD-10-CM

## 2016-07-26 DIAGNOSIS — F17200 Nicotine dependence, unspecified, uncomplicated: Secondary | ICD-10-CM

## 2016-07-26 DIAGNOSIS — B9689 Other specified bacterial agents as the cause of diseases classified elsewhere: Secondary | ICD-10-CM | POA: Diagnosis not present

## 2016-07-26 LAB — WET PREP FOR TRICH, YEAST, CLUE
Trich, Wet Prep: NONE SEEN
YEAST WET PREP: NONE SEEN

## 2016-07-26 MED ORDER — VALACYCLOVIR HCL 500 MG PO TABS: ORAL_TABLET | ORAL | 11 refills | Status: DC

## 2016-07-26 MED ORDER — VARENICLINE TARTRATE 1 MG PO TABS: 1.0000 mg | ORAL_TABLET | Freq: Two times a day (BID) | ORAL | 0 refills | Status: DC

## 2016-07-26 MED ORDER — VARENICLINE TARTRATE 0.5 MG PO TABS: ORAL_TABLET | ORAL | 2 refills | Status: DC

## 2016-07-26 MED ORDER — TINIDAZOLE 500 MG PO TABS: ORAL_TABLET | ORAL | 0 refills | Status: DC

## 2016-07-26 NOTE — Telephone Encounter (Signed)
Note from pharmacy. "  Will MD update to pharmacy a Rx for Chantix 1 mg for bid for after patient completes starter on 0.5 mg tabs 9that Rx was sent today)?"

## 2016-07-26 NOTE — Telephone Encounter (Signed)
Rx sent 

## 2016-07-26 NOTE — Patient Instructions (Addendum)
Perimenopause Perimenopause is the time when your body begins to move into the menopause (no menstrual period for 12 straight months). It is a natural process. Perimenopause can begin 2-8 years before the menopause and usually lasts for 1 year after the menopause. During this time, your ovaries may or may not produce an egg. The ovaries vary in their production of estrogen and progesterone hormones each month. This can cause irregular menstrual periods, difficulty getting pregnant, vaginal bleeding between periods, and uncomfortable symptoms. CAUSES  Irregular production of the ovarian hormones, estrogen and progesterone, and not ovulating every month.  Other causes include:  Tumor of the pituitary gland in the brain.  Medical disease that affects the ovaries.  Radiation treatment.  Chemotherapy.  Unknown causes.  Heavy smoking and excessive alcohol intake can bring on perimenopause sooner. SIGNS AND SYMPTOMS   Hot flashes.  Night sweats.  Irregular menstrual periods.  Decreased sex drive.  Vaginal dryness.  Headaches.  Mood swings.  Depression.  Memory problems.  Irritability.  Tiredness.  Weight gain.  Trouble getting pregnant.  The beginning of losing bone cells (osteoporosis).  The beginning of hardening of the arteries (atherosclerosis). DIAGNOSIS  Your health care provider will make a diagnosis by analyzing your age, menstrual history, and symptoms. He or she will do a physical exam and note any changes in your body, especially your female organs. Female hormone tests may or may not be helpful depending on the amount of female hormones you produce and when you produce them. However, other hormone tests may be helpful to rule out other problems. TREATMENT  In some cases, no treatment is needed. The decision on whether treatment is necessary during the perimenopause should be made by you and your health care provider based on how the symptoms are affecting you  and your lifestyle. Various treatments are available, such as:  Treating individual symptoms with a specific medicine for that symptom.  Herbal medicines that can help specific symptoms.  Counseling.  Group therapy. HOME CARE INSTRUCTIONS   Keep track of your menstrual periods (when they occur, how heavy they are, how long between periods, and how long they last) as well as your symptoms and when they started.  Only take over-the-counter or prescription medicines as directed by your health care provider.  Sleep and rest.  Exercise.  Eat a diet that contains calcium (good for your bones) and soy (acts like the estrogen hormone).  Do not smoke.  Avoid alcoholic beverages.  Take vitamin supplements as recommended by your health care provider. Taking vitamin E may help in certain cases.  Take calcium and vitamin D supplements to help prevent bone loss.  Group therapy is sometimes helpful.  Acupuncture may help in some cases. SEEK MEDICAL CARE IF:   You have questions about any symptoms you are having.  You need a referral to a specialist (gynecologist, psychiatrist, or psychologist). SEEK IMMEDIATE MEDICAL CARE IF:   You have vaginal bleeding.  Your period lasts longer than 8 days.  Your periods are recurring sooner than 21 days.  You have bleeding after intercourse.  You have severe depression.  You have pain when you urinate.  You have severe headaches.  You have vision problems. This information is not intended to replace advice given to you by your health care provider. Make sure you discuss any questions you have with your health care provider. Document Released: 09/13/2004 Document Revised: 08/27/2014 Document Reviewed: 03/05/2013 Elsevier Interactive Patient Education  2017 [employer]. Steps to [employer]  Smoking Smoking tobacco can be harmful to your health and can affect almost every organ in your body. Smoking puts you, and those around you, at risk for  developing many serious chronic diseases. Quitting smoking is difficult, but it is one of the best things that you can do for your health. It is never too late to quit. What are the benefits of quitting smoking? When you quit smoking, you lower your risk of developing serious diseases and conditions, such as:  Lung cancer or lung disease, such as COPD.  Heart disease.  Stroke.  Heart attack.  Infertility.  Osteoporosis and bone fractures. Additionally, symptoms such as coughing, wheezing, and shortness of breath may get better when you quit. You may also find that you get sick less often because your body is stronger at fighting off colds and infections. If you are pregnant, quitting smoking can help to reduce your chances of having a baby of low birth weight. How do I get ready to quit? When you decide to quit smoking, create a plan to make sure that you are successful. Before you quit:  Pick a date to quit. Set a date within the next two weeks to give you time to prepare.  Write down the reasons why you are quitting. Keep this list in places where you will see it often, such as on your bathroom mirror or in your car or wallet.  Identify the people, places, things, and activities that make you want to smoke (triggers) and avoid them. Make sure to take these actions:  Throw away all cigarettes at home, at work, and in your car.  Throw away smoking accessories, such as Scientist, research (medical).  Clean your car and make sure to empty the ashtray.  Clean your home, including curtains and carpets.  Tell your family, friends, and coworkers that you are quitting. Support from your loved ones can make quitting easier.  Talk with your health care provider about your options for quitting smoking.  Find out what treatment options are covered by your health insurance. What strategies can I use to quit smoking? Talk with your healthcare provider about different strategies to quit smoking.  Some strategies include:  Quitting smoking altogether instead of gradually lessening how much you smoke over a period of time. Research shows that quitting "cold [place]" is more successful than gradually quitting.  Attending in-person counseling to help you build problem-solving skills. You are more likely to have success in quitting if you attend several counseling sessions. Even short sessions of 10 minutes can be effective.  Finding resources and support systems that can help you to quit smoking and remain smoke-free after you quit. These resources are most helpful when you use them often. They can include:  Online chats with a Social worker.  Telephone quitlines.  Printed Furniture conservator/restorer.  Support groups or group counseling.  Text messaging programs.  Mobile phone applications.  Taking medicines to help you quit smoking. (If you are pregnant or breastfeeding, talk with your health care provider first.) Some medicines contain nicotine and some do not. Both types of medicines help with cravings, but the medicines that include nicotine help to relieve withdrawal symptoms. Your health care provider may recommend:  Nicotine patches, gum, or lozenges.  Nicotine inhalers or sprays.  Non-nicotine medicine that is taken by mouth. Talk with your health care provider about combining strategies, such as taking medicines while you are also receiving in-person counseling. Using these two strategies together makes you more likely to  succeed in quitting than if you used either strategy on its own. If you are pregnant or breastfeeding, talk with your health care provider about finding counseling or other support strategies to quit smoking. Do not take medicine to help you quit smoking unless told to do so by your health care provider. What things can I do to make it easier to quit? Quitting smoking might feel overwhelming at first, but there is a lot that you can do to make it easier. Take these  important actions:  Reach out to your family and friends and ask that they support and encourage you during this time. Call telephone quitlines, reach out to support groups, or work with a counselor for support.  Ask people who smoke to avoid smoking around you.  Avoid places that trigger you to smoke, such as bars, parties, or smoke-break areas at work.  Spend time around people who do not smoke.  Lessen stress in your life, because stress can be a smoking trigger for some people. To lessen stress, try:  Exercising regularly.  Deep-breathing exercises.  Yoga.  Meditating.  Performing a body scan. This involves closing your eyes, scanning your body from head to toe, and noticing which parts of your body are particularly tense. Purposefully relax the muscles in those areas.  Download or purchase mobile phone or tablet apps (applications) that can help you stick to your quit plan by providing reminders, tips, and encouragement. There are many free apps, such as QuitGuide from the [employer] Office manager for Disease Control and Prevention). You can find other support for quitting smoking (smoking cessation) through smokefree.gov and other websites. How will I feel when I quit smoking? Within the first 24 hours of quitting smoking, you may start to feel some withdrawal symptoms. These symptoms are usually most noticeable 2-3 days after quitting, but they usually do not last beyond 2-3 weeks. Changes or symptoms that you might experience include:  Mood swings.  Restlessness, anxiety, or irritation.  Difficulty concentrating.  Dizziness.  Strong cravings for sugary foods in addition to nicotine.  Mild weight gain.  Constipation.  Nausea.  Coughing or a sore throat.  Changes in how your medicines work in your body.  A depressed mood.  Difficulty sleeping (insomnia). After the first 2-3 weeks of quitting, you may start to notice more positive results, such as:  Improved sense of smell  and taste.  Decreased coughing and sore throat.  Slower heart rate.  Lower blood pressure.  Clearer skin.  The ability to breathe more easily.  Fewer sick days. Quitting smoking is very challenging for most people. Do not get discouraged if you are not successful the first time. Some people need to make many attempts to quit before they achieve long-term success. Do your best to stick to your quit plan, and talk with your health care provider if you have any questions or concerns. This information is not intended to replace advice given to you by your health care provider. Make sure you discuss any questions you have with your health care provider. Document Released: 07/31/2001 Document Revised: 04/03/2016 Document Reviewed: 12/21/2014 Elsevier Interactive Patient Education  2017 Elsevier Inc.  Bacterial Vaginosis Bacterial vaginosis is a vaginal infection that occurs when the normal balance of bacteria in the vagina is disrupted. It results from an overgrowth of certain bacteria. This is the most common vaginal infection among women ages 75-44. Because bacterial vaginosis increases your risk for STIs (sexually transmitted infections), getting treated can help reduce your  risk for chlamydia, gonorrhea, herpes, and HIV (human immunodeficiency virus). Treatment is also important for preventing complications in pregnant women, because this condition can cause an early (premature) delivery. What are the causes? This condition is caused by an increase in harmful bacteria that are normally present in small amounts in the vagina. However, the reason that the condition develops is not fully understood. What increases the risk? The following factors may make you more likely to develop this condition:  Having a new sexual partner or multiple sexual partners.  Having unprotected sex.  Douching.  Having an intrauterine device (IUD).  Smoking.  Drug and alcohol abuse.  Taking certain  antibiotic medicines.  Being pregnant. You cannot get bacterial vaginosis from toilet seats, bedding, swimming pools, or contact with objects around you. What are the signs or symptoms? Symptoms of this condition include:  Grey or white vaginal discharge. The discharge can also be watery or foamy.  A fish-like odor with discharge, especially after sexual intercourse or during menstruation.  Itching in and around the vagina.  Burning or pain with urination. Some women with bacterial vaginosis have no signs or symptoms. How is this diagnosed? This condition is diagnosed based on:  Your medical history.  A physical exam of the vagina.  Testing a sample of vaginal fluid under a microscope to look for a large amount of bad bacteria or abnormal cells. Your health care provider may use a cotton swab or a small wooden spatula to collect the sample. How is this treated? This condition is treated with antibiotics. These may be given as a pill, a vaginal cream, or a medicine that is put into the vagina (suppository). If the condition comes back after treatment, a second round of antibiotics may be needed. Follow these instructions at home: Medicines  Take over-the-counter and prescription medicines only as told by your health care provider.  Take or use your antibiotic as told by your health care provider. Do not stop taking or using the antibiotic even if you start to feel better. General instructions  If you have a female sexual partner, tell her that you have a vaginal infection. She should see her health care provider and be treated if she has symptoms. If you have a female sexual partner, he does not need treatment.  During treatment:  Avoid sexual activity until you finish treatment.  Do not douche.  Avoid alcohol as directed by your health care provider.  Avoid breastfeeding as directed by your health care provider.  Drink enough water and fluids to keep your urine clear or  pale yellow.  Keep the area around your vagina and rectum clean.  Wash the area daily with warm water.  Wipe yourself from front to back after using the toilet.  Keep all follow-up visits as told by your health care provider. This is important. How is this prevented?  Do not douche.  Wash the outside of your vagina with warm water only.  Use protection when having sex. This includes latex condoms and dental dams.  Limit how many sexual partners you have. To help prevent bacterial vaginosis, it is best to have sex with just one partner (monogamous).  Make sure you and your sexual partner are tested for STIs.  Wear cotton or cotton-lined underwear.  Avoid wearing tight pants and pantyhose, especially during summer.  Limit the amount of alcohol that you drink.  Do not use any products that contain nicotine or tobacco, such as cigarettes and e-cigarettes. If you need  help quitting, ask your health care provider.  Do not use illegal drugs. Where to find more information:  Centers for Disease Control and Prevention: AppraiserFraud.fi  American Sexual Health Association (ASHA): www.ashastd.org  [place] Department of Health and Financial controller, Office on Women's Health: DustingSprays.pl or SecuritiesCard.it Contact a health care provider if:  Your symptoms do not improve, even after treatment.  You have more discharge or pain when urinating.  You have a fever.  You have pain in your abdomen.  You have pain during sex.  You have vaginal bleeding between periods. Summary  Bacterial vaginosis is a vaginal infection that occurs when the normal balance of bacteria in the vagina is disrupted.  Because bacterial vaginosis increases your risk for STIs (sexually transmitted infections), getting treated can help reduce your risk for chlamydia, gonorrhea, herpes, and HIV (human immunodeficiency virus). Treatment is also important for  preventing complications in pregnant women, because the condition can cause an early (premature) delivery.  This condition is treated with antibiotic medicines. These may be given as a pill, a vaginal cream, or a medicine that is put into the vagina (suppository). This information is not intended to replace advice given to you by your health care provider. Make sure you discuss any questions you have with your health care provider. Document Released: 08/06/2005 Document Revised: 04/21/2016 Document Reviewed: 04/21/2016 Elsevier Interactive Patient Education  2017 [employer].

## 2016-07-26 NOTE — Addendum Note (Signed)
Addended by: Thurnell Garbe A on: 07/26/2016 12:57 PM   Modules accepted: Orders

## 2016-07-26 NOTE — Telephone Encounter (Signed)
Yes - thank you

## 2016-07-26 NOTE — Telephone Encounter (Signed)
Yes 30 days worth

## 2016-07-26 NOTE — Progress Notes (Signed)
Ashley Bailey 10-24-1970 [id number]   History:    45 y.o.  for annual gyn exam with several complaints today: #1: Patient has been with a new sexual partner the past few months she has use condoms at times. She does have a previous tubal ligation procedure. Patient would like to have an STD screen. She was complaining of vaginal discharge with odor clear but no vulvar pruritus. #2 patient with some occasional vasomotor symptoms and her last menstrual period was in August 2017. #3 patient with past history of ovarian cyst. Follow-up ultrasound August 2017 cyst no longer present small intramural fibroid 17 x 17 mm. #4 patient chronic smoker smokes half a pack cigarette per day for over 15 years  Patient had colonoscopy this year as a result of her history of irritable bowel syndrome no polyps were noted she did have upper endoscopy work gastritis was documented.   Past medical history,surgical history, family history and social history were all reviewed and documented in the EPIC chart.  Gynecologic History Patient's last menstrual period was 05/26/2016. Contraception: tubal ligation Last Pap: 2014. 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 Results were: Normal but dense 3-D  Obstetric History OB History  Gravida Para Term Preterm AB Living  3 2     1 2   SAB TAB Ectopic Multiple Live Births               # Outcome Date GA Lbr Len/2nd Weight Sex Delivery Anes PTL Lv  3 AB           2 Para           1 Para                ROS: A ROS was performed and pertinent positives and negatives are included in the history.  GENERAL: No fevers or chills. HEENT: No change in vision, no earache, sore throat or sinus congestion. NECK: No pain or stiffness. CARDIOVASCULAR: No chest pain or pressure. No palpitations. PULMONARY: No shortness of breath, cough or wheeze. GASTROINTESTINAL: No abdominal pain, nausea, vomiting or diarrhea, melena or bright red  blood per rectum. GENITOURINARY: No urinary frequency, urgency, hesitancy or dysuria. MUSCULOSKELETAL: No joint or muscle pain, no back pain, no recent trauma. DERMATOLOGIC: No rash, no itching, no lesions. ENDOCRINE: No polyuria, polydipsia, no heat or cold intolerance. No recent change in weight. HEMATOLOGICAL: No anemia or easy bruising or bleeding. NEUROLOGIC: No headache, seizures, numbness, tingling or weakness. PSYCHIATRIC: No depression, no loss of interest in normal activity or change in sleep pattern.     Exam: chaperone present  BP 128/86   Ht 5\' 6"  (1.676 m)   Wt 217 lb (98.4 kg)   LMP 05/26/2016   BMI 35.02 kg/m   Body mass index is 35.02 kg/m.  General appearance : Well developed well nourished female. No acute distress HEENT: Eyes: no retinal hemorrhage or exudates,  Neck supple, trachea midline, no carotid bruits, no thyroidmegaly Lungs: Clear to auscultation, no rhonchi or wheezes, or rib retractions  Heart: Regular rate and rhythm, no murmurs or gallops Breast:Examined in sitting and supine position were symmetrical in appearance, no palpable masses or tenderness,  no skin retraction, no nipple inversion, no nipple discharge, no skin discoloration, no axillary or supraclavicular lymphadenopathy Abdomen: no palpable masses or tenderness, no rebound or guarding Extremities: no edema or skin discoloration or tenderness  Pelvic:  Bartholin, Urethra, Skene Glands: Within normal limits  Vagina: No gross lesions or discharge  Cervix: No gross lesions or discharge  Uterus  anteverted, normal size, shape and consistency, non-tender and mobile  Adnexa  Without masses or tenderness  Anus and perineum  normal   Rectovaginal  normal sphincter tone without palpated masses or tenderness             Hemoccult not indicated   Wet prep: Moderate clue cells, few white blood cell, too numerous to count bacteria  Assessment/Plan:  45 y.o. female for annual exam with  clinical evidence of bacterial vaginosis will be treated with Tindamax 500 mg tablets. She is to take 4 tablets today repeat in 24 hours. A GC and Chlamydia culture was obtained along with a HIV, RPR, hepatitis B and C to complete the STD screen. Because of her history of Pap smear with positive HPV a Pap smear with HPV screening was done today as well. Because of her perimenopausal symptoms and FSH will be drawn today. Literature information was provided on the perimenopause. Also we had a lengthy discussion on the detrimental effects of smoking and also treatment with Chantix the risks benefits and pros and cons were discussed. Also discussed importance of monthly breast exams.  Additional 15 minutes was spent discussing different treatment options for bacterial vaginosis. Also spent time of counseling on the detrimental effects of smoking as well as the risks benefits and pros and cons of Chantix. Literature formation was provided on anti-spoking techniques provided.   [name] MD, 12:11 PM 07/26/2016

## 2016-07-26 NOTE — Telephone Encounter (Signed)
Pt had annual exam today Rx for valtrex 500 mg was never sent to pharmacy. Okay to send with refills?

## 2016-07-27 LAB — FOLLICLE STIMULATING HORMONE: FSH: 27.7 m[IU]/mL

## 2016-07-27 LAB — GC/CHLAMYDIA PROBE AMP
CT PROBE, AMP APTIMA: NOT DETECTED
GC Probe RNA: NOT DETECTED

## 2016-07-27 LAB — HIV ANTIBODY (ROUTINE TESTING W REFLEX): HIV 1&2 Ab, 4th Generation: NONREACTIVE

## 2016-07-27 LAB — HEPATITIS C ANTIBODY: HCV Ab: NEGATIVE

## 2016-07-27 LAB — RPR

## 2016-07-27 LAB — PAP, TP IMAGING W/ HPV RNA, RFLX HPV TYPE 16,18/45: HPV mRNA, High Risk: NOT DETECTED

## 2016-07-27 LAB — HEPATITIS B SURFACE ANTIGEN: Hepatitis B Surface Ag: NEGATIVE

## 2016-08-07 ENCOUNTER — Other Ambulatory Visit: Payer: BLUE CROSS/BLUE SHIELD

## 2016-08-15 ENCOUNTER — Other Ambulatory Visit: Payer: BLUE CROSS/BLUE SHIELD

## 2016-08-21 ENCOUNTER — Other Ambulatory Visit: Payer: Self-pay | Admitting: Gynecology

## 2016-09-20 ENCOUNTER — Ambulatory Visit (INDEPENDENT_AMBULATORY_CARE_PROVIDER_SITE_OTHER): Payer: BLUE CROSS/BLUE SHIELD | Admitting: Allergy & Immunology

## 2016-09-20 ENCOUNTER — Encounter: Payer: Self-pay | Admitting: Allergy & Immunology

## 2016-09-20 VITALS — BP 114/74 | HR 88 | Temp 98.0°F | Ht 66.0 in | Wt 219.0 lb

## 2016-09-20 DIAGNOSIS — L508 Other urticaria: Secondary | ICD-10-CM

## 2016-09-20 DIAGNOSIS — J3089 Other allergic rhinitis: Secondary | ICD-10-CM | POA: Diagnosis not present

## 2016-09-20 DIAGNOSIS — R062 Wheezing: Secondary | ICD-10-CM

## 2016-09-20 DIAGNOSIS — T781XXD Other adverse food reactions, not elsewhere classified, subsequent encounter: Secondary | ICD-10-CM | POA: Diagnosis not present

## 2016-09-20 DIAGNOSIS — L259 Unspecified contact dermatitis, unspecified cause: Secondary | ICD-10-CM

## 2016-09-20 LAB — CBC WITH DIFFERENTIAL/PLATELET
BASOS ABS: 65 {cells}/uL (ref 0–200)
Basophils Relative: 1 %
Eosinophils Absolute: 130 cells/uL (ref 15–500)
Eosinophils Relative: 2 %
HEMATOCRIT: 43.3 % (ref 35.0–45.0)
Hemoglobin: 15.5 g/dL (ref 11.7–15.5)
LYMPHS PCT: 31 %
Lymphs Abs: 2015 cells/uL (ref 850–3900)
MCH: 34.8 pg — AB (ref 27.0–33.0)
MCHC: 35.8 g/dL (ref 32.0–36.0)
MCV: 97.1 fL (ref 80.0–100.0)
MONO ABS: 455 {cells}/uL (ref 200–950)
MPV: 9.8 fL (ref 7.5–12.5)
Monocytes Relative: 7 %
NEUTROS PCT: 59 %
Neutro Abs: 3835 cells/uL (ref 1500–7800)
Platelets: 253 10*3/uL (ref 140–400)
RBC: 4.46 MIL/uL (ref 3.80–5.10)
RDW: 14.5 % (ref 11.0–15.0)
WBC: 6.5 10*3/uL (ref 3.8–10.8)

## 2016-09-20 NOTE — Patient Instructions (Addendum)
1. Chronic urticaria - We will get some blood work to look for serious causes of hives.  - In the meantime, start suppressive dosing of antihistamines: Allegra 180mg  in the morning and Xyzal 5mg  in the evening. - You can increase these doses at home until the symptoms improve. - If these are not working, we can talk about doing a monthly injectable medication called Xolair.  2. Adverse food reaction - Testing was negative to all of the most common foods, including fish and shellfish. - You can go back to eating this as much as you would like.   3. Perennial allergic rhinitis - Testing was positive to [place] grass, Johnson grass, ragweed, cats, and dust mite. - The antihistamines should help with some of the symptoms. - Avoidance measures provided.  4. Return in about 4 days (around 09/24/2016). for patch testing (chemicals/fragrances).  Please inform us of any Emergency Department visits, hospitalizations, or changes in symptoms. Call us before going to the ED for breathing or allergy symptoms since we might be able to fit you in for a sick visit. Feel free to contact us anytime with any questions, problems, or concerns.  It was a pleasure to meet you today! Best wishes in the [place] Year!   Websites that have reliable patient information: 1. American Academy of Asthma, Allergy, and Immunology: www.aaaai.org 2. Food Allergy Research and Education (FARE): foodallergy.org 3. Mothers of Asthmatics: http://www.asthmacommunitynetwork.org 4. American College of Allergy, Asthma, and Immunology: www.acaai.org  Reducing Pollen Exposure  The American Academy of Allergy, Asthma and Immunology suggests the following steps to reduce your exposure to pollen during allergy seasons.    1. Do not hang sheets or clothing out to dry; pollen may collect on these items. 2. Do not mow lawns or spend time around freshly cut grass; mowing stirs up pollen. 3. Keep windows closed at night.  Keep car windows  closed while driving. 4. Minimize morning activities outdoors, a time when pollen counts are usually at their highest. 5. Stay indoors as much as possible when pollen counts or humidity is high and on windy days when pollen tends to remain in the air longer. 6. Use air conditioning when possible.  Many air conditioners have filters that trap the pollen spores. 7. Use a HEPA room air filter to remove pollen form the indoor air you breathe.  Control of Dog or Cat Allergen  Avoidance is the best way to manage a dog or cat allergy. If you have a dog or cat and are allergic to dog or cats, consider removing the dog or cat from the home. If you have a dog or cat but don't want to find it a new home, or if your family wants a pet even though someone in the household is allergic, here are some strategies that may help keep symptoms at bay:  1. Keep the pet out of your bedroom and restrict it to only a few rooms. Be advised that keeping the dog or cat in only one room will not limit the allergens to that room. 2. Don't pet, hug or kiss the dog or cat; if you do, wash your hands with soap and water. 3. High-efficiency particulate air (HEPA) cleaners run continuously in a bedroom or living room can reduce allergen levels over time. 4. Regular use of a high-efficiency vacuum cleaner or a central vacuum can reduce allergen levels. 5. Giving your dog or cat a bath at least once a week can reduce airborne allergen.  Control  of [place] dust mites play a major role in allergic asthma and rhinitis.  They occur in environments with high humidity wherever human skin, the food for dust mites is found. High levels have been detected in dust obtained from mattresses, pillows, carpets, upholstered furniture, bed covers, clothes and soft toys.  The principal allergen of the house dust mite is found in its feces.  A gram of dust may contain 1,000 mites and 250,000 fecal particles.  Mite antigen  is easily measured in the air during house cleaning activities.    1. Encase mattresses, including the box spring, and pillow, in an air tight cover.  Seal the zipper end of the encased mattresses with wide adhesive tape. 2. Wash the bedding in water of 130 degrees Farenheit weekly.  Avoid cotton comforters/quilts and flannel bedding: the most ideal bed covering is the dacron comforter. 3. Remove all upholstered furniture from the bedroom. 4. Remove carpets, carpet padding, rugs, and non-washable window drapes from the bedroom.  Wash drapes weekly or use plastic window coverings. 5. Remove all non-washable stuffed toys from the bedroom.  Wash stuffed toys weekly. 6. Have the room cleaned frequently with a vacuum cleaner and a damp dust-mop.  The patient should not be in a room which is being cleaned and should wait 1 hour after cleaning before going into the room. 7. Close and seal all heating outlets in the bedroom.  Otherwise, the room will become filled with dust-laden air.  An electric heater can be used to heat the room. 8. Reduce indoor humidity to less than 50%.  Do not use a humidifier.

## 2016-09-20 NOTE — Progress Notes (Addendum)
NEW PATIENT  Date Bailey Service/Encounter:  09/20/16  Referring provider: Leamon Arnt, MD   Assessment:   Chronic urticaria  Dermatitis  Adverse food reaction  Perennial allergic rhinitis  Wheezing    Plan/Recommendations:    1. Chronic urticaria - unknown trigger - Testing today was not significant for any finding that would adequately explain the hives.  - We will get some blood work to look for serious causes Bailey hives: CBC with differential, serum tryptase, chronic urticaria panel - In the meantime, I recommended starting suppressive dosing Bailey antihistamines: Allegra 149m in the morning and Xyzal 513min the evening. - I did encourage Ashley Bailey to titrate these medications at home to effect. - We did briefly discuss Xolair and information provided. - We can initiate approval for Xolair if there is no improvement with the antihistamines alone. - Ms. McOnofrioontinue to think that her hives are related to her fragrance some kind Bailey cleaning agent.  - She was requesting testing for these kinds Bailey chemicals. - We do not perform IgE mediated testing to these types Bailey chemicals, but we do have patch testing.  - However, patch testing assesses for a delayed type hypersensitivity rather than immediate sensitivities. - I do not feel that delayed type hypersensitivity related to her hives, but she insisted on getting this testing done. - Therefore, I recommended that she come back on Monday for patch test placement.  2. Adverse food reaction - Testing was negative to all Bailey the most common foods, including fish and shellfish.  - I recommended that Ashley Bailey back to eating fish and shellfish ad lib.   3. Perennial allergic rhinitis - Testing was positive to [place], [place] grass, ragweed, cats, and dust mite. - The antihistamines should help with some Bailey the symptoms. - Avoidance measures provided. - Symptoms do not appear severe enough to warrant  allergy shots at this time.   4. Return in about 4 days (around 09/24/2016) for patch testing (chemicals/fragrances).    Subjective:   Ashley Bailey  Chief Complaint  Patient presents with  . New Evaluation    C/O itching and hives    Ashley PABSTas a history Bailey the following: Patient Active Problem List   Diagnosis Date Noted  . Perimenopause 07/26/2016  . Esophageal reflux   . Dyspepsia 11/09/2015  . Smoker 05/14/2013    History obtained from: chart review and patient.  Ashley Bailey referred Bailey ANLeamon ArntMD.     Ashley Bailey a [age].o. female presenting for evaluation Bailey hives . She was previously followed in our clinic, and was last seen in June 2012 Bailey Dr. HiIshmael Holterwho has since left the practice. At that time, she had testing that was reactive to trees, molds, and dust mite. There was also concern for a fish allergy, although FISH testing was negative. She was continued on Claritin 10 mg daily, saline lavage twice daily, as well as environmental controls. She did have lab testing that was negative to multiple foods including shrimp, garlic, onions, green pepper, and cat fish. Her total IgE was 13.2.  She presents today for a new evaluation Bailey hives/dermatitis and itching. This has been going on for four weeks and she has become rather miserable. She is using hydroxyzine, last taken Monday night. The hydroxyzine does make her sleep and seems to help. She does have grogginess the following. She has  not tried non-sedating antihistamines for this problems. She has no fevers with these symptoms. Lesions stick around for approximately 3-4 minutes. They resolve without pigmentation issues. There are no systemic symptoms with these. She does not know whether there are foods involved. She has been using Tide with fragrance in it. She is unsure whether there is something that she has become allergic to but  feels that there is definitely some kind Bailey chemical exposures or even a fragrance exposure. She denis symptoms Bailey hypothyroidism.   Prior to four weeks ago, she did have the baseline itching but this is more extreme at this point. She has tried keeping "a handle" on the dust mite including putting her pillows in the dryer. She does not use dust mite coverings on her pillows, however. She does eat shrimp and catfish occasionally and does develop some itching. She took it out Bailey her diet completely within the last month.   She does have IBS treated with probiotics. There have been no new medications. She is on HCTZ for edema (7-8 years total). She has clonazepam to use as needed. Otherwise, there is no history Bailey other atopic diseases, including asthma, drug allergies, environmental allergies, or stinging insect allergies. There is no significant infectious history. Vaccinations are up to date.    Past Medical History: Patient Active Problem List   Diagnosis Date Noted  . Perimenopause 07/26/2016  . Esophageal reflux   . Dyspepsia 11/09/2015  . Smoker 05/14/2013    Medication List:  Allergies as Bailey 09/20/2016      Reactions   Shrimp [shellfish Allergy] Itching   Itching when eating shrimp. Patient does eat shrimp.       Medication List       Accurate as Bailey 09/20/16 12:31 PM. Always use your most recent med list.          hydrochlorothiazide 25 MG tablet Commonly known as:  HYDRODIURIL Take 25 mg Bailey mouth daily.   hydrOXYzine 25 MG capsule Commonly known as:  VISTARIL Take 25 mg Bailey mouth daily as needed for itching.   KLONOPIN 0.5 MG tablet Generic drug:  clonazePAM Take 0.5 mg Bailey mouth daily as needed for anxiety.   multivitamin with minerals Tabs tablet Take 1 tablet Bailey mouth daily.   pantoprazole 40 MG tablet Commonly known as:  PROTONIX Take 1 tablet (40 mg total) Bailey mouth daily.   potassium chloride 10 MEQ CR capsule Commonly known as:  MICRO-K Take 10 mEq Bailey mouth  daily as needed (low potassium).   RESTORA Caps Take 1 capsule Bailey mouth daily.   valACYclovir 500 MG tablet Commonly known as:  VALTREX Take one tablet Bailey mouth twice daily for 3-5 days for outbreak then daily       Birth History: non-contributory. Born at term without complications.   Developmental History: Amanii has met all milestones on time. She has required no speech therapy, occupational therapy, or physical therapy.   Past Surgical History: Past Surgical History:  Procedure Laterality Date  . CESAREAN SECTION     X2  . COLONOSCOPY N/A 12/06/2015   Procedure: COLONOSCOPY;  Surgeon: Danie Binder, MD;  [facility];  Service: Endoscopy;  Laterality: N/A;  1145 - moved to 12:00 - office to notify  . ESOPHAGOGASTRODUODENOSCOPY N/A 12/06/2015   Procedure: ESOPHAGOGASTRODUODENOSCOPY (EGD);  Surgeon: Danie Binder, MD;  Location: [facility];  Service: Endoscopy;  Laterality: N/A;  . HYSTEROSCOPY WITH RESECTOSCOPE N/A 01/17/2016   Procedure: HYSTEROSCOPY WITH  RESECTOSCOPIC POLYPECTOMY WITH MYOSURE;  Surgeon: [name], MD;  Location: [facility];  Service: Gynecology;  Laterality: N/A;  . TONSILLECTOMY    . TONSILLECTOMY AND ADENOIDECTOMY     denies adenoids just tonsillectomy  . TUBAL LIGATION       Family History: Family History  Problem Relation Age Bailey Onset  . Hypertension Mother   . Diabetes Sister   . Colon cancer Neg Hx   . Allergic rhinitis Neg Hx   . Asthma Neg Hx   . Angioedema Neg Hx   . Atopy Neg Hx   . Eczema Neg Hx   . Immunodeficiency Neg Hx   . Urticaria Neg Hx      Social History: Brannon lives at home with her family. She lives in a 47 year old condo. There is carpeting throughout. They have electric heating and central cooling. There are no animals inside or outside the home. There are no dust mite coverings on the bedding. She is a smoker, has smoked half pack per day for 20 years.   Review Bailey Systems: a 14-point review Bailey  systems is pertinent for what is mentioned in HPI.  Otherwise, all other systems were negative. Constitutional: negative other than that listed in the HPI Eyes: negative other than that listed in the HPI Ears, nose, mouth, throat, and face: negative other than that listed in the HPI Respiratory: negative other than that listed in the HPI Cardiovascular: negative other than that listed in the HPI Gastrointestinal: negative other than that listed in the HPI Genitourinary: negative other than that listed in the HPI Integument: negative other than that listed in the HPI Hematologic: negative other than that listed in the HPI Musculoskeletal: negative other than that listed in the HPI Neurological: negative other than that listed in the HPI Allergy/Immunologic: negative other than that listed in the HPI    Objective:   Blood pressure 114/74, pulse 88, temperature 98 F (36.7 C), temperature source Oral, height _0  (1.676 m), weight 219 lb (99.3 kg), SpO2 98 %. Body mass index is 35.35 kg/m.   Physical Exam:  General: Alert, interactive, in no acute distress. Pleasant female. Eyes: No conjunctival injection present on the right, No conjunctival injection present on the left, PERRL bilaterally, No discharge on the right, No discharge on the left and No Horner-Trantas dots present Ears: Right TM pearly gray with normal light reflex, Left TM pearly gray with normal light reflex, Right TM intact without perforation and Left TM intact without perforation.  Nose/Throat: External nose within normal limits and septum midline, turbinates edematous and pale with thick discharge, post-pharynx erythematous without cobblestoning in the posterior oropharynx. Tonsils 2+ without exudates Neck: Supple without thyromegaly. Adenopathy: no enlarged lymph nodes appreciated in the anterior cervical, occipital, axillary, epitrochlear, inguinal, or popliteal regions Lungs: Clear to auscultation without rhonchi  or rales. There is expiratory wheezing appreciated in the inferior lung fields. No increased work Bailey breathing. CV: Normal S1/S2, no murmurs. Capillary refill <2 seconds.  Abdomen: Nondistended, nontender. No guarding or rebound tenderness. Bowel sounds faint and present in all fields  Skin: Warm and dry, without lesions or rashes. Extremities:  No clubbing, cyanosis or edema. Neuro:   Grossly intact. No focal deficits appreciated. Responsive to questions.  Diagnostic studies:  Spirometry: results normal (FEV1: 2.28/94%, FVC: 2.95/100%, FEV1/FVC: 77%).    Spirometry consistent with normal pattern.   Allergy Studies:   Indoor/Outdoor Percutaneous Adult Environmental Panel: negative to entire panel with adequate controls   Indoor/Outdoor  Selected Intradermal Environmental Panel: positive to [place] grass, [place] grass, ragweed mix, cat and mite mix. Otherwise negative with adequate controls.  Most Common Foods Panel (peanut, cashew, soy, fish mix, shellfish mix, wheat, milk, egg): negative with adequate controls   Salvatore Marvel, MD [facility]

## 2016-09-21 LAB — TRYPTASE: TRYPTASE: 4.8 ug/L (ref <11)

## 2016-09-24 ENCOUNTER — Encounter: Payer: Self-pay | Admitting: Allergy

## 2016-09-24 ENCOUNTER — Ambulatory Visit (INDEPENDENT_AMBULATORY_CARE_PROVIDER_SITE_OTHER): Payer: BLUE CROSS/BLUE SHIELD | Admitting: Allergy

## 2016-09-24 VITALS — BP 118/78 | HR 97 | Temp 98.3°F | Resp 16 | Ht 66.0 in | Wt 219.0 lb

## 2016-09-24 DIAGNOSIS — L508 Other urticaria: Secondary | ICD-10-CM | POA: Diagnosis not present

## 2016-09-24 DIAGNOSIS — L259 Unspecified contact dermatitis, unspecified cause: Secondary | ICD-10-CM

## 2016-09-24 NOTE — Progress Notes (Signed)
Follow-up Note  RE: Ashley Bailey MRN: [id number] DOB: [birth year] Date of Office Visit: 09/24/2016   History of present illness: Ashley Bailey is a 46 y.o. female presenting today for patch test placement. She was seen by Dr. Ernst Bowler on 09-20-16 for chronic urticaria, dermatitis, adverse food reaction, allergic rhinitis and wheezing.   Her chronic urticaria dermatitis he recommended that she patch testing to assess for delayed type hypersensitivity use.  She returns today for patch placement. She reports that she is still somewhat itchy but has not had hives since her last visit.  She is on Allegra in the morning and Xyzal in the evening.   Review of systems: Review of Systems  Constitutional: Negative for chills, fever and malaise/fatigue.  HENT: Negative for congestion, sinus pain and sore throat.   Eyes: Negative for discharge and redness.  Respiratory: Negative for cough, shortness of breath and wheezing.   Cardiovascular: Negative for chest pain.  Gastrointestinal: Negative for abdominal pain, heartburn, nausea and vomiting.  Skin: Positive for itching. Negative for rash.    All other systems negative unless noted above in HPI  Past medical/social/surgical/family history have been reviewed and are unchanged unless specifically indicated below.  No changes  Medication List: Allergies as of 09/24/2016      Reactions   Shrimp [shellfish Allergy] Itching   Itching when eating shrimp. Patient does eat shrimp.       Medication List       Accurate as of 09/24/16 11:26 AM. Always use your most recent med list.          hydrochlorothiazide 25 MG tablet Commonly known as:  HYDRODIURIL Take 25 mg by mouth daily.   hydrOXYzine 25 MG capsule Commonly known as:  VISTARIL Take 25 mg by mouth daily as needed for itching.   KLONOPIN 0.5 MG tablet Generic drug:  clonazePAM Take 0.5 mg by mouth daily as needed for anxiety.   multivitamin with minerals Tabs  tablet Take 1 tablet by mouth daily.   pantoprazole 40 MG tablet Commonly known as:  PROTONIX Take 1 tablet (40 mg total) by mouth daily.   potassium chloride 10 MEQ CR capsule Commonly known as:  MICRO-K Take 10 mEq by mouth daily as needed (low potassium).   RESTORA Caps Take 1 capsule by mouth daily.   valACYclovir 500 MG tablet Commonly known as:  VALTREX Take one tablet by mouth twice daily for 3-5 days for outbreak then daily       Known medication allergies: Allergies  Allergen Reactions  . Shrimp [Shellfish Allergy] Itching    Itching when eating shrimp. Patient does eat shrimp.      Physical examination: Blood pressure 118/78, pulse 97, temperature 98.3 F (36.8 C), temperature source Oral, resp. rate 16, height 5\' 6"  (1.676 m), weight 219 lb (99.3 kg), SpO2 96 %.  General: Alert, interactive, in no acute distress. Skin: Warm and dry, without lesions or rashes.   No urticarial lesions Extremities:  No clubbing, cyanosis or edema. Neuro:   Grossly intact.  Diagnositics/Labs: True test patches applied, marked and taped on back  Assessment and plan:   Chronic urticaria/dermatitis  - TRUE test patches applied and taped on back today.   - Return Wednesday 09/26/16 for initial reading and again on Friday 09/28/16 for last reading.    - Do not get patches wet.    - You can take your antihistamines and routine medications while patches are on.    I appreciate  the opportunity to take part in [place] care. Please do not hesitate to contact me with questions.  Sincerely,   Prudy Feeler, MD Allergy/Immunology [facility]

## 2016-09-24 NOTE — Patient Instructions (Signed)
TRUE test patches applied and taped on back today.   Return Wednesday 09/26/16 for initial reading and again on Friday 09/28/16 for last reading.    Do not get patches wet.    You can take your antihistamines and routine medications while patches are on.

## 2016-09-26 ENCOUNTER — Encounter: Payer: BLUE CROSS/BLUE SHIELD | Admitting: Allergy & Immunology

## 2016-09-26 NOTE — Progress Notes (Addendum)
    Follow-up Note  RE: KOSISOCHUKWU SANDFORD MRN: [id number] DOB: [birth year] Date of Office Visit: 09/24/2016  Primary care provider: Leamon Arnt, MD Referring provider: Leamon Arnt, MD   Ash returns to the office today for the initial patch test interpretation, given suspected history of contact dermatitis.    Diagnostics:  TRUE TEST 48 hour reading: positive to #35 (disperse blue) with questionable reactions to #6 (fragrance mix) and #10 (Balsam of [place]).   Plan:  Allergic contact dermatitis  The patient has been provided detailed information regarding the substances she is sensitive to, as well as products containing the substances.  Meticulous avoidance of these substances is recommended. If avoidance is not possible, the use of barrier creams or lotions is recommended. If symptoms persist or progress despite meticulous avoidance of the above chemical mixes, dermatology evaluation may be warranted.  Salvatore Marvel, MD [place] -------------------------------------------------------------------------  Afreen returns to the office today for the final patch test interpretation, given suspected history of contact dermatitis.    Diagnostics:  TRUE TEST 96 hour reading: She had no new findings.    Assessment and Plan:   - We'll prescribe triamcinolone ointment 0.1% for use on the rash.  Advised if the rash is not related to possible contact dermatitis she may not find much benefit with use.   - She had not started her high-dose antihistamine regimen advised that she did with Dr. Gillermina Hu recommendations of Allegra in the morning and Xyzal in the evening.  Advised that she may add on Zantac 150 twice a day if she continues to have issues with itch and hives.  - Rest of plan for contact dermatitis as above per Dr. Geni Bers, MD [facility]

## 2016-09-28 ENCOUNTER — Encounter: Payer: BLUE CROSS/BLUE SHIELD | Admitting: Allergy

## 2016-09-28 MED ORDER — TRIAMCINOLONE ACETONIDE 0.1 % EX OINT: 1.0000 "application " | TOPICAL_OINTMENT | Freq: Two times a day (BID) | CUTANEOUS | 3 refills | Status: DC

## 2016-09-28 NOTE — Addendum Note (Signed)
Addended by: Theresia Lo on: 09/28/2016 01:02 PM   Modules accepted: Orders

## 2016-10-01 LAB — CP CHRONIC URTICARIA INDEX PANEL
Histamine Release: <16 % (ref <16)
TSH: 1.86 mIU/L
Thyroglobulin Ab: <1 IU/mL (ref <2)

## 2016-10-04 ENCOUNTER — Ambulatory Visit: Payer: BLUE CROSS/BLUE SHIELD | Admitting: Allergy

## 2016-11-01 ENCOUNTER — Other Ambulatory Visit: Payer: Self-pay | Admitting: Gynecology

## 2016-11-01 ENCOUNTER — Telehealth: Payer: Self-pay

## 2016-11-01 MED ORDER — FLUCONAZOLE 150 MG PO TABS: 150.0000 mg | ORAL_TABLET | Freq: Once | ORAL | 1 refills | Status: AC

## 2016-11-01 NOTE — Telephone Encounter (Signed)
Patient on Clindamycin with dentist. Experiencing vaginal yeast inf sx.  Requesting a Diflucan tablet. Ok?

## 2016-11-01 NOTE — Telephone Encounter (Signed)
Uterus Diflucan 150 mg one by mouth with 1 refill

## 2016-11-25 ENCOUNTER — Other Ambulatory Visit: Payer: Self-pay | Admitting: Gastroenterology

## 2016-12-27 ENCOUNTER — Telehealth: Payer: Self-pay | Admitting: Allergy

## 2016-12-27 MED ORDER — FEXOFENADINE HCL 180 MG PO TABS: 180.0000 mg | ORAL_TABLET | Freq: Every day | ORAL | 5 refills | Status: DC

## 2016-12-27 NOTE — Telephone Encounter (Signed)
Pt called and needs to have a rx of Allegra 24 non drowsy called into [facility] [address] [phone].

## 2016-12-27 NOTE — Telephone Encounter (Signed)
Called patient. I informed patient that I sent in a script for Allegra.

## 2017-01-02 ENCOUNTER — Encounter: Payer: Self-pay | Admitting: Gynecology

## 2017-06-28 ENCOUNTER — Other Ambulatory Visit: Payer: Self-pay | Admitting: Allergy

## 2017-07-23 ENCOUNTER — Other Ambulatory Visit: Payer: Self-pay | Admitting: Gastroenterology

## 2017-07-26 ENCOUNTER — Other Ambulatory Visit: Payer: Self-pay

## 2017-07-29 MED ORDER — PANTOPRAZOLE SODIUM 40 MG PO TBEC: 40.0000 mg | DELAYED_RELEASE_TABLET | Freq: Every day | ORAL | 3 refills | Status: AC

## 2017-09-22 ENCOUNTER — Other Ambulatory Visit: Payer: Self-pay | Admitting: Allergy

## 2018-07-02 ENCOUNTER — Encounter

## 2018-09-26 ENCOUNTER — Encounter: Payer: Self-pay | Admitting: Obstetrics & Gynecology

## 2018-09-26 ENCOUNTER — Ambulatory Visit (INDEPENDENT_AMBULATORY_CARE_PROVIDER_SITE_OTHER): Payer: BLUE CROSS/BLUE SHIELD | Admitting: Obstetrics & Gynecology

## 2018-09-26 VITALS — BP 138/88 | Ht 66.0 in | Wt 225.0 lb

## 2018-09-26 DIAGNOSIS — E6609 Other obesity due to excess calories: Secondary | ICD-10-CM

## 2018-09-26 DIAGNOSIS — F17210 Nicotine dependence, cigarettes, uncomplicated: Secondary | ICD-10-CM | POA: Diagnosis not present

## 2018-09-26 DIAGNOSIS — Z01419 Encounter for gynecological examination (general) (routine) without abnormal findings: Secondary | ICD-10-CM | POA: Diagnosis not present

## 2018-09-26 DIAGNOSIS — Z113 Encounter for screening for infections with a predominantly sexual mode of transmission: Secondary | ICD-10-CM

## 2018-09-26 DIAGNOSIS — Z6836 Body mass index (BMI) 36.0-36.9, adult: Secondary | ICD-10-CM

## 2018-09-26 DIAGNOSIS — Z9851 Tubal ligation status: Secondary | ICD-10-CM

## 2018-09-26 NOTE — Progress Notes (Signed)
Ashley Bailey [birth year] [id number]   History:    48 y.o. G3P2A1L2 Single.  S/P TL.  New boyfriend x 3 months  RP:  Established patient presenting for annual gyn exam   HPI: Menstrual periods every month with normal flow.  No breakthrough bleeding.  No pelvic pain.  No pain with intercourse.  Normal vaginal secretions.  New boyfriend x 3 months.  Not using condoms.  Breast normal.  Body mass index 36.32.  Needs to increase fitness activities.  Health labs with family physician.    Past medical history,surgical history, family history and social history were all reviewed and documented in the EPIC chart.  Gynecologic History No LMP recorded. Contraception: tubal ligation Last Pap: 04/2016. Results were: Negative/HPV HR neg Last mammogram: 07/2016. Results were: Rt negative.  Lt need further imaging, not done. Bone Density: Never Colonoscopy: 11/2015  Obstetric History OB History  Gravida Para Term Preterm AB Living  3 2     1 2   SAB TAB Ectopic Multiple Live Births               # Outcome Date GA Lbr Len/2nd Weight Sex Delivery Anes PTL Lv  3 AB           2 Para           1 Para              ROS: A ROS was performed and pertinent positives and negatives are included in the history.  GENERAL: No fevers or chills. HEENT: No change in vision, no earache, sore throat or sinus congestion. NECK: No pain or stiffness. CARDIOVASCULAR: No chest pain or pressure. No palpitations. PULMONARY: No shortness of breath, cough or wheeze. GASTROINTESTINAL: No abdominal pain, nausea, vomiting or diarrhea, melena or bright red blood per rectum. GENITOURINARY: No urinary frequency, urgency, hesitancy or dysuria. MUSCULOSKELETAL: No joint or muscle pain, no back pain, no recent trauma. DERMATOLOGIC: No rash, no itching, no lesions. ENDOCRINE: No polyuria, polydipsia, no heat or cold intolerance. No recent change in weight. HEMATOLOGICAL: No anemia or easy bruising or bleeding. NEUROLOGIC: No  headache, seizures, numbness, tingling or weakness. PSYCHIATRIC: No depression, no loss of interest in normal activity or change in sleep pattern.     Exam:   BP 138/88   Ht 5\' 6"  (1.676 m)   Wt 225 lb (102.1 kg)   BMI 36.32 kg/m   Body mass index is 36.32 kg/m.  General appearance : Well developed well nourished female. No acute distress HEENT: Eyes: no retinal hemorrhage or exudates,  Neck supple, trachea midline, no carotid bruits, no thyroidmegaly Lungs: Clear to auscultation, no rhonchi or wheezes, or rib retractions  Heart: Regular rate and rhythm, no murmurs or gallops Breast:Examined in sitting and supine position were symmetrical in appearance, no palpable masses or tenderness,  no skin retraction, no nipple inversion, no nipple discharge, no skin discoloration, no axillary or supraclavicular lymphadenopathy Abdomen: no palpable masses or tenderness, no rebound or guarding Extremities: no edema or skin discoloration or tenderness  Pelvic: Vulva: Normal             Vagina: No gross lesions or discharge  Cervix: No gross lesions or discharge.  Pap/HPV HR, Gono-Chlam  Uterus  AV, normal size, shape and consistency, non-tender and mobile  Adnexa  Without masses or tenderness  Anus: Normal   Assessment/Plan:  47 y.o. female for annual exam   1. Encounter for routine gynecological examination with Papanicolaou smear of  cervix Normal gynecologic exam.  Pap test with high-risk HPV done today.  Breast exam normal.  Will schedule screening mammogram now.  Colonoscopy in April 2017.  Health labs with family physician.  2. S/P tubal ligation  3. Screen for STD (sexually transmitted disease) Recommend condom use - Gono-Chlam on Pap - HIV antibody (with reflex) - RPR - Hepatitis C Antibody - Hepatitis B Surface AntiGEN  4. Cigarette smoker Strongly recommended to quit smoking.  Will start with weaning.  5. Class 2 obesity due to excess calories without serious comorbidity  with body mass index (BMI) of 36.0 to 36.9 in adult Low calorie/carb diet such as [employer] recommended.  Aerobic activities 5x per week and weight lifting every 2 days.  Other orders - hydrOXYzine (ATARAX/VISTARIL) 25 MG tablet; Take by mouth. - potassium chloride SA (K-DUR,KLOR-CON) 20 MEQ tablet; Take by mouth. - SOLOSEC 2 g Polly Cobia MD, 4:06 PM 09/26/2018

## 2018-09-26 NOTE — Patient Instructions (Signed)
1. Encounter for routine gynecological examination with Papanicolaou smear of cervix Normal gynecologic exam.  Pap test with high-risk HPV done today.  Breast exam normal.  Will schedule screening mammogram now.  Colonoscopy in April 2017.  Health labs with family physician.  2. S/P tubal ligation  3. Screen for STD (sexually transmitted disease) Recommend condom use - Gono-Chlam on Pap - HIV antibody (with reflex) - RPR - Hepatitis C Antibody - Hepatitis B Surface AntiGEN  4. Cigarette smoker Strongly recommended to quit smoking.  Will start with weaning.  5. Class 2 obesity due to excess calories without serious comorbidity with body mass index (BMI) of 36.0 to 36.9 in adult Low calorie/carb diet such as [employer] recommended.  Aerobic activities 5x per week and weight lifting every 2 days.  Other orders - hydrOXYzine (ATARAX/VISTARIL) 25 MG tablet; Take by mouth. - potassium chloride SA (K-DUR,KLOR-CON) 20 MEQ tablet; Take by mouth. - SOLOSEC 2 g PACK  Ashley Bailey, it was a pleasure meeting you today!  I will inform you of your results as soon as they are available.

## 2018-09-29 LAB — HEPATITIS C ANTIBODY
HEP C AB: NONREACTIVE
SIGNAL TO CUT-OFF: 0.05 (ref <1.00)

## 2018-09-29 LAB — HIV ANTIBODY (ROUTINE TESTING W REFLEX): HIV: NONREACTIVE

## 2018-09-29 LAB — RPR: RPR: NONREACTIVE

## 2018-09-29 LAB — HEPATITIS B SURFACE ANTIGEN: HEP B S AG: NONREACTIVE

## 2018-09-30 LAB — PAP IG, CT-NG NAA, HPV HIGH-RISK
C. TRACHOMATIS RNA, TMA: NOT DETECTED
HPV DNA High Risk: NOT DETECTED
N. GONORRHOEAE RNA, TMA: NOT DETECTED

## 2018-11-10 ENCOUNTER — Telehealth: Payer: Self-pay | Admitting: *Deleted

## 2018-11-10 MED ORDER — FLUCONAZOLE 150 MG PO TABS: 150.0000 mg | ORAL_TABLET | Freq: Every day | ORAL | 0 refills | Status: DC

## 2018-11-10 NOTE — Telephone Encounter (Signed)
Yes can send Fluconazole 150 mg 1 tab daily x 3.

## 2018-11-10 NOTE — Telephone Encounter (Signed)
Rx sent, patient aware.

## 2018-11-10 NOTE — Telephone Encounter (Signed)
Patient called c/o yeast infection from taking antibiotic prescribed by dentist, asked if diflucan tablet can be sent? Please advise

## 2019-10-15 ENCOUNTER — Ambulatory Visit: Payer: BC Managed Care – PPO | Admitting: Allergy

## 2019-10-15 ENCOUNTER — Encounter: Payer: Self-pay | Admitting: Allergy

## 2019-10-15 ENCOUNTER — Other Ambulatory Visit: Payer: Self-pay

## 2019-10-15 VITALS — BP 132/82 | HR 93 | Temp 97.9°F | Resp 16 | Ht 66.0 in | Wt 254.0 lb

## 2019-10-15 DIAGNOSIS — L508 Other urticaria: Secondary | ICD-10-CM

## 2019-10-15 DIAGNOSIS — L2489 Irritant contact dermatitis due to other agents: Secondary | ICD-10-CM

## 2019-10-15 DIAGNOSIS — J4520 Mild intermittent asthma, uncomplicated: Secondary | ICD-10-CM | POA: Diagnosis not present

## 2019-10-15 DIAGNOSIS — J3089 Other allergic rhinitis: Secondary | ICD-10-CM

## 2019-10-15 MED ORDER — MONTELUKAST SODIUM 10 MG PO TABS: 10.0000 mg | ORAL_TABLET | Freq: Every day | ORAL | 5 refills | Status: DC

## 2019-10-15 MED ORDER — ALBUTEROL SULFATE HFA 108 (90 BASE) MCG/ACT IN AERS: 2.0000 | INHALATION_SPRAY | RESPIRATORY_TRACT | 1 refills | Status: DC | PRN

## 2019-10-15 MED ORDER — IPRATROPIUM BROMIDE 0.06 % NA SOLN: 2.0000 | Freq: Three times a day (TID) | NASAL | 5 refills | Status: DC

## 2019-10-15 NOTE — Progress Notes (Signed)
New Patient Note  RE: Ashley Bailey MRN: [id number] DOB: March 26, 1971 Date of Office Visit: 10/15/2019  Referring provider: No ref. provider found Primary care provider: Bartholome Bill, MD  Chief Complaint: Rashes  History of present illness: Ashley Bailey is a 49 y.o. female presenting today for evaluation of urticaria, contact dermatitis and allergic rhinitis.  She is a former patient of the practice with her last visit in September 24, 2016.  She returns today as she states she is having similar issues as she did previously.  At her last visit she did have patch testing performed that was positive to several different contact allergens including disperse blue, fragrance mix and balsam of [place].  She also had environmental allergy testing done that was positive to grass pollens, ragweed, cats and dust mites.  She had negative food allergies skin testing.  She also had a work-up done to classify her urticaria and it was reassuring. She states she still has a lot of itching and will have a hive outbreak about 5-6 times a year.  Her last episode of hives was about 1-1/2 months ago.  She does take Allegra daily. Her biggest concern is that she has been having worsening symptoms with exposures to strong odors and scents.  She states with Covid going on she has been having to clean more which requires using more hand sanitizer and cleaning products that are all heavily scented.  Having this exposure to the strong odors she has been developing symptoms of wheezing, cough as well as runny nose and congestion.  She has not been using any nasal sprays at this time nor has she ever used any inhalers.  She also has not been on Singulair before. She is congested today as she has been off her allegra for this visit today.   She does not avoid any foods for any reason at this time.  Review of systems: Review of Systems  Constitutional: Negative.   HENT: Positive for congestion.   Eyes:  Negative.   Respiratory: Positive for cough and wheezing.   Cardiovascular: Negative.   Gastrointestinal: Negative.   Musculoskeletal: Negative.   Skin: Positive for itching.  Neurological: Negative.     All other systems negative unless noted above in HPI  Past medical history: Past Medical History:  Diagnosis Date  . Anxiety   . Arthritis    wrist, knee  . Edema   . IBS (irritable bowel syndrome)   . Seasonal allergies     Past surgical history: Past Surgical History:  Procedure Laterality Date  . CESAREAN SECTION     X2  . COLONOSCOPY N/A 12/06/2015   Procedure: COLONOSCOPY;  Surgeon: Danie Binder, MD;  Location: [facility];  Service: Endoscopy;  Laterality: N/A;  1145 - moved to 12:00 - office to notify  . ESOPHAGOGASTRODUODENOSCOPY N/A 12/06/2015   Procedure: ESOPHAGOGASTRODUODENOSCOPY (EGD);  Surgeon: Danie Binder, MD;  Location: [facility];  Service: Endoscopy;  Laterality: N/A;  . HYSTEROSCOPY WITH RESECTOSCOPE N/A 01/17/2016   Procedure: HYSTEROSCOPY WITH RESECTOSCOPIC POLYPECTOMY WITH MYOSURE;  Surgeon: [name], MD;  Location: [facility];  Service: Gynecology;  Laterality: N/A;  . TONSILLECTOMY    . TONSILLECTOMY AND ADENOIDECTOMY     denies adenoids just tonsillectomy  . TUBAL LIGATION      Family history:  Family History  Problem Relation Age of Onset  . Hypertension Mother   . Diabetes Sister   . Colon cancer Neg Hx   .  Allergic rhinitis Neg Hx   . Asthma Neg Hx   . Angioedema Neg Hx   . Atopy Neg Hx   . Eczema Neg Hx   . Immunodeficiency Neg Hx   . Urticaria Neg Hx     Social history: She lives in a home without carpeting with electric heating and window cooling.  There are cats inside the home.  There is no concern for roaches in the home.  She is on disability.  She denies a smoking history.  Medication List: Current Outpatient Medications  Medication Sig Dispense Refill  . Ascorbic Acid (VITAMIN C) 1000 MG tablet Take 1,000 mg  by mouth daily.    Marland Kitchen b complex vitamins tablet Take 1 tablet by mouth daily.    . cholecalciferol (VITAMIN D3) 25 MCG (1000 UNIT) tablet Take 1,000 Units by mouth daily.    . fexofenadine (ALLEGRA) 180 MG tablet TAKE 1 TABLET BY MOUTH EVERY DAY 30 tablet 1  . hydrochlorothiazide (HYDRODIURIL) 25 MG tablet Take 25 mg by mouth daily.    . Multiple Vitamin (MULTIVITAMIN WITH MINERALS) TABS tablet Take 1 tablet by mouth daily.    . pantoprazole (PROTONIX) 40 MG tablet Take 1 tablet (40 mg total) by mouth daily. 90 tablet 3  . potassium chloride (MICRO-K) 10 MEQ CR capsule Take 10 mEq by mouth daily as needed (low potassium).     . potassium chloride SA (K-DUR,KLOR-CON) 20 MEQ tablet Take by mouth.    . Probiotic Product (RESTORA) CAPS TAKE 1 CAPSULE BY MOUTH DAILY. 90 capsule 2  . clonazePAM (KLONOPIN) 0.5 MG tablet Take 0.5 mg by mouth daily as needed for anxiety.     . fluconazole (DIFLUCAN) 150 MG tablet Take 1 tablet (150 mg total) by mouth daily. (Patient not taking: Reported on 10/15/2019) 3 tablet 0  . hydrOXYzine (ATARAX/VISTARIL) 25 MG tablet Take by mouth.    . hydrOXYzine (VISTARIL) 25 MG capsule Take 25 mg by mouth daily as needed for itching.      No current facility-administered medications for this visit.    Known medication allergies: Allergies  Allergen Reactions  . Blue Dyes (Parenteral)   . Pollen Extract      Physical examination: Blood pressure 132/82, pulse 93, temperature 97.9 F (36.6 C), resp. rate 16, height 5\' 6"  (1.676 m), weight 254 lb (115.2 kg), SpO2 98 %.  General: Alert, interactive, in no acute distress. HEENT: PERRLA, TMs pearly gray, turbinates mildly edematous with clear discharge, post-pharynx non erythematous. Neck: Supple without lymphadenopathy. Lungs: Clear to auscultation without wheezing, rhonchi or rales. {no increased work of breathing. CV: Normal S1, S2 without murmurs. Abdomen: Nondistended, nontender. Skin: Warm and dry, without lesions  or rashes. Extremities:  No clubbing, cyanosis or edema. Neuro:   Grossly intact.  Diagnositics/Labs: None today  Assessment and plan: Chronic idiopathic urticaria Contact dermatitis Allergic rhinitis Reactive airway  - continue Allegra 180mg  daily  - start Singulair 10mg  daily at bedtime.  Singulair is a leukotriene blocker that can help control reactive airway symptoms, allergy symptoms and skin symptoms like hive and itch.  If you notice any change in mood/behavior/sleep after starting Singulair then stop this medication and let us know.  Symptoms resolve after stopping the medication.    - for nasal congestion/drainage recommend use of nasal Atrovent 2 sprays each nostril up to 3-4 times a day as needed for nasal congestion/drainage  - continue to avoid scents/strong odors.  Continue avoidance of the following that were positive to patch  testing: disperse blue, fragrance mix, Balsam of [place]  - continue avoidance measures for grass pollens, weed pollens, cats, dust mites  Follow-up 4-6 months or sooner if needed  I appreciate the opportunity to take part in Mishel's care. Please do not hesitate to contact me with questions.  Sincerely,   Prudy Feeler, MD Allergy/Immunology [facility] [place]

## 2019-10-15 NOTE — Patient Instructions (Addendum)
 -   continue Allegra 180mg  daily  - start Singulair 10mg  daily at bedtime.  Singulair is a leukotriene blocker that can help control reactive airway symptoms, allergy symptoms and skin symptoms like hive and itch.  If you notice any change in mood/behavior/sleep after starting Singulair then stop this medication and let us know.  Symptoms resolve after stopping the medication.    - for nasal congestion/drainage recommend use of nasal Atrovent 2 sprays each nostril up to 3-4 times a day as needed for nasal congestion/drainage  - continue to avoid scents/strong odors.  Continue avoidance of the following that were positive to patch testing: disperse blue, fragrance mix, Balsam of [place]  - continue avoidance measures for grass pollens, weed pollens, cats, dust mites  Follow-up 4-6 months or sooner if needed

## 2020-02-17 ENCOUNTER — Other Ambulatory Visit: Payer: Self-pay

## 2020-02-17 ENCOUNTER — Ambulatory Visit (HOSPITAL_COMMUNITY)
Admission: EM | Admit: 2020-02-17 | Discharge: 2020-02-17 | Disposition: A | Discharge status: Home / Self Care | Payer: BC Managed Care – PPO | Attending: Family Medicine | Admitting: Family Medicine

## 2020-02-17 ENCOUNTER — Encounter (HOSPITAL_COMMUNITY): Payer: Self-pay

## 2020-02-17 DIAGNOSIS — M545 Low back pain, unspecified: Secondary | ICD-10-CM

## 2020-02-17 MED ORDER — PREDNISONE 10 MG PO TABS: ORAL_TABLET | ORAL | 0 refills | Status: DC

## 2020-02-17 MED ORDER — TIZANIDINE HCL 4 MG PO TABS: 4.0000 mg | ORAL_TABLET | Freq: Four times a day (QID) | ORAL | 0 refills | Status: DC | PRN

## 2020-02-17 MED ORDER — TRAMADOL HCL 50 MG PO TABS: 50.0000 mg | ORAL_TABLET | Freq: Four times a day (QID) | ORAL | 0 refills | Status: DC | PRN

## 2020-02-17 NOTE — ED Triage Notes (Signed)
Pt states she moved and has been lifting heavy boxes and she started to have lower back pain. Pt states she has deteriorating disc in her lower back and isn't supposed lift heavy thing, but didn't have a choice, because she was moving. Pt states this started 3 days ago. Pt denies numbness and tingling. Pt walked slow to triage room.

## 2020-02-17 NOTE — Discharge Instructions (Signed)
Begin prednisone taper over the next 6 days-begin with 6 tabs/60 mg on day 1, decrease by 1 tablet each day until complete-6, 5, 4, 3, 2, 1-take with food in the morning if you are able  May use tizanidine which is a muscle relaxer at home/bedtime  Tramadol for severe pain, do not drive or work after taking  Please follow-up if any symptoms not improving or worsening

## 2020-02-18 NOTE — ED Provider Notes (Signed)
[place]    CSN: [id number] Arrival date & time: 02/17/20  1918      History   Chief Complaint Chief Complaint  Patient presents with  . Back Pain    HPI Ashley Bailey is a 49 y.o. female history of IBS, presenting today for evaluation of back pain. Patient reports that she began to have lower back pain over the past 3 to 4 days. Symptoms began after lifting heavy boxes as she is moving to a new house. Reports she has history of similar back pain in the past and has known degenerative disc disease in her lower back. Has previously seen orthopedics for this, but was unable to obtain appointment until next week. She denies radiation into legs. Denies numbness or tingling. Denies any specific injury fall or trauma. Denies any urinary symptoms. Reports improvement in the past with tramadol and prednisone. She does not like a muscle relaxers make her feel. Denies history of diabetes.  HPI  Past Medical History:  Diagnosis Date  . Anxiety   . Arthritis    wrist, knee  . Edema   . IBS (irritable bowel syndrome)   . Seasonal allergies     Patient Active Problem List   Diagnosis Date Noted  . Perimenopause 07/26/2016  . Esophageal reflux   . Dyspepsia 11/09/2015  . Smoker 05/14/2013    Past Surgical History:  Procedure Laterality Date  . CESAREAN SECTION     X2  . COLONOSCOPY N/A 12/06/2015   Procedure: COLONOSCOPY;  Surgeon: Danie Binder, MD;  [facility];  Service: Endoscopy;  Laterality: N/A;  1145 - moved to 12:00 - office to notify  . ESOPHAGOGASTRODUODENOSCOPY N/A 12/06/2015   Procedure: ESOPHAGOGASTRODUODENOSCOPY (EGD);  Surgeon: Danie Binder, MD;  Location: [facility];  Service: Endoscopy;  Laterality: N/A;  . HYSTEROSCOPY WITH RESECTOSCOPE N/A 01/17/2016   Procedure: HYSTEROSCOPY WITH RESECTOSCOPIC POLYPECTOMY WITH MYOSURE;  Surgeon: [name], MD;  Location: [facility];  Service: Gynecology;  Laterality: N/A;  . TONSILLECTOMY     . TONSILLECTOMY AND ADENOIDECTOMY     denies adenoids just tonsillectomy  . TUBAL LIGATION      OB History    Gravida  3   Para  2   Term      Preterm      AB  1   Living  2     SAB      TAB      Ectopic      Multiple      Live Births               Home Medications    Prior to Admission medications   Medication Sig Start Date End Date Taking? Authorizing Provider  clonazePAM (KLONOPIN) 1 MG tablet Take 1 mg by mouth 2 (two) times daily as needed for anxiety.   Yes [provider]  albuterol (VENTOLIN HFA) 108 (90 Base) MCG/ACT inhaler Inhale 2 puffs into the lungs every 4 (four) hours as needed for wheezing or shortness of breath. 10/15/19   Padgett, Rae Halsted, MD  Ascorbic Acid (VITAMIN C) 1000 MG tablet Take 1,000 mg by mouth daily.    [provider]  b complex vitamins tablet Take 1 tablet by mouth daily.    [provider]  cholecalciferol (VITAMIN D3) 25 MCG (1000 UNIT) tablet Take 1,000 Units by mouth daily.    [provider]  clonazePAM (KLONOPIN) 0.5 MG tablet Take 0.5 mg  by mouth daily as needed for anxiety.  10/13/15 09/24/16  [provider]  fexofenadine (ALLEGRA) 180 MG tablet TAKE 1 TABLET BY MOUTH EVERY DAY 09/23/17   Kennith Gain, MD  fluconazole (DIFLUCAN) 150 MG tablet Take 1 tablet (150 mg total) by mouth daily. Patient not taking: Reported on 10/15/2019 11/10/18   Princess Bruins, MD  hydrochlorothiazide (HYDRODIURIL) 25 MG tablet Take 25 mg by mouth daily.    [provider]  Multiple Vitamin (MULTIVITAMIN WITH MINERALS) TABS tablet Take 1 tablet by mouth daily.    [provider]  pantoprazole (PROTONIX) 40 MG tablet Take 1 tablet (40 mg total) by mouth daily. 07/29/17   Annitta Needs, NP  predniSONE (DELTASONE) 10 MG tablet Begin with 6 tabs on day 1, 5 tab on day 2, 4 tab on day 3, 3 tab on day 4, 2 tab on day 5, 1 tab on day 6-take with food 02/17/20   Catelin Manthe,  [place] C, PA-C  Probiotic Product (RESTORA) CAPS TAKE 1 CAPSULE BY MOUTH DAILY. 11/26/16   Annitta Needs, NP  tiZANidine (ZANAFLEX) 4 MG tablet Take 1 tablet (4 mg total) by mouth every 6 (six) hours as needed for muscle spasms. 02/17/20   Corbitt Cloke C, PA-C  traMADol (ULTRAM) 50 MG tablet Take 1 tablet (50 mg total) by mouth every 6 (six) hours as needed. 02/17/20   Rheba Diamond C, PA-C  ipratropium (ATROVENT) 0.06 % nasal spray Place 2 sprays into both nostrils 3 (three) times daily. 10/15/19 02/17/20  Kennith Gain, MD  montelukast (SINGULAIR) 10 MG tablet Take 1 tablet (10 mg total) by mouth at bedtime. 10/15/19 02/17/20  Kennith Gain, MD  potassium chloride (MICRO-K) 10 MEQ CR capsule Take 10 mEq by mouth daily as needed (low potassium).   02/17/20  [provider]  potassium chloride SA (K-DUR,KLOR-CON) 20 MEQ tablet Take by mouth. 09/17/18 02/17/20  [provider]    Family History Family History  Problem Relation Age of Onset  . Hypertension Mother   . Diabetes Sister   . Colon cancer Neg Hx   . Allergic rhinitis Neg Hx   . Asthma Neg Hx   . Angioedema Neg Hx   . Atopy Neg Hx   . Eczema Neg Hx   . Immunodeficiency Neg Hx   . Urticaria Neg Hx     Social History Social History   Tobacco Use  . Smoking status: Current Every Day Smoker    Packs/day: 0.50    Years: 10.00    Pack years: 5.00    Types: Cigarettes  . Smokeless tobacco: Never Used  Vaping Use  . Vaping Use: Never used  Substance Use Topics  . Alcohol use: Yes    Alcohol/week: 0.0 standard drinks    Comment: occasionally on weekends   . Drug use: No     Allergies   Blue dyes (parenteral) and Pollen extract   Review of Systems Review of Systems  Constitutional: Negative for activity change, chills, diaphoresis and fatigue.  HENT: Negative for ear pain, tinnitus and trouble swallowing.   Eyes: Negative for photophobia and visual disturbance.  Respiratory:  Negative for cough, chest tightness and shortness of breath.   Cardiovascular: Negative for chest pain and leg swelling.  Gastrointestinal: Negative for abdominal pain, blood in stool, nausea and vomiting.  Musculoskeletal: Positive for back pain and myalgias. Negative for arthralgias, gait problem, neck pain and neck stiffness.  Skin: Negative for color change and wound.  Neurological: Negative for dizziness, weakness, light-headedness, numbness and headaches.     Physical Exam Triage Vital Signs ED Triage Vitals  Enc Vitals Group     BP 02/17/20 1939 140/90     Pulse Rate 02/17/20 1939 85     Resp 02/17/20 1939 18     Temp 02/17/20 1939 98.7 F (37.1 C)     Temp Source 02/17/20 1939 Oral     SpO2 02/17/20 1939 98 %     Weight 02/17/20 1943 220 lb (99.8 kg)     Height 02/17/20 1943 5\' 6"  (1.676 m)     Head Circumference --      Peak Flow --      Pain Score 02/17/20 1942 9     Pain Loc --      Pain Edu? --      Excl. in [place]? --    No data found.  Updated Vital Signs BP 140/90   Pulse 85   Temp 98.7 F (37.1 C) (Oral)   Resp 18   Ht 5\' 6"  (1.676 m)   Wt 220 lb (99.8 kg)   SpO2 98%   BMI 35.51 kg/m   Visual Acuity Right Eye Distance:   Left Eye Distance:   Bilateral Distance:    Right Eye Near:   Left Eye Near:    Bilateral Near:     Physical Exam Vitals and nursing note reviewed.  Constitutional:      Appearance: She is well-developed.     Comments: No acute distress  HENT:     Head: Normocephalic and atraumatic.     Nose: Nose normal.  Eyes:     Conjunctiva/sclera: Conjunctivae normal.  Cardiovascular:     Rate and Rhythm: Normal rate.  Pulmonary:     Effort: Pulmonary effort is normal. No respiratory distress.  Abdominal:     General: There is no distension.  Musculoskeletal:        General: Normal range of motion.     Cervical back: Neck supple.     Comments: Back: Nontender to palpation of thoracic spine midline, tenderness to palpation of  lower lumbar spine midline into sacral area, paraspinal muscular tenderness bilaterally  Strength at hips and knees 5/5 and equal bilaterally Patellar reflex 2+ bilaterally  Skin:    General: Skin is warm and dry.  Neurological:     Mental Status: She is alert and oriented to person, place, and time.      UC Treatments / Results  Labs (all labs ordered are listed, but only abnormal results are displayed) Labs Reviewed - No data to display  EKG   Radiology No results found.  Procedures Procedures (including critical care time)  Medications Ordered in UC Medications - No data to display  Initial Impression / Assessment and Plan / UC Course  I have reviewed the triage vital signs and the nursing notes.  Pertinent labs & imaging results that were available during my care of the patient were reviewed by me and considered in my medical decision making (see chart for details).     Low back pain, likely muscular etiology, no acute mechanism of injury, do not suspect acute bony abnormality. Has been using NSAIDs without relief, will switch to prednisone taper, tizanidine and tramadol for severe pain. Activity modification, follow-up with Ortho if symptoms persisting. No neuro deficits, no red flags for cauda equina.  Discussed strict return precautions. Patient verbalized understanding and is agreeable with plan.  Final Clinical Impressions(s) / UC  Diagnoses   Final diagnoses:  Acute midline low back pain without sciatica     Discharge Instructions     Begin prednisone taper over the next 6 days-begin with 6 tabs/60 mg on day 1, decrease by 1 tablet each day until complete-6, 5, 4, 3, 2, 1-take with food in the morning if you are able  May use tizanidine which is a muscle relaxer at home/bedtime  Tramadol for severe pain, do not drive or work after taking  Please follow-up if any symptoms not improving or worsening   ED Prescriptions    Medication Sig Dispense Auth.  Provider   predniSONE (DELTASONE) 10 MG tablet Begin with 6 tabs on day 1, 5 tab on day 2, 4 tab on day 3, 3 tab on day 4, 2 tab on day 5, 1 tab on day 6-take with food 21 tablet Kiyah Demartini C, PA-C   tiZANidine (ZANAFLEX) 4 MG tablet Take 1 tablet (4 mg total) by mouth every 6 (six) hours as needed for muscle spasms. 30 tablet Kimberleigh Mehan C, PA-C   traMADol (ULTRAM) 50 MG tablet Take 1 tablet (50 mg total) by mouth every 6 (six) hours as needed. 12 tablet Kymiah Araiza, [place] C, PA-C     I have reviewed the PDMP during this encounter.   Janith Lima, PA-C 02/18/20 1603

## 2020-04-07 ENCOUNTER — Other Ambulatory Visit: Payer: Self-pay | Admitting: Allergy

## 2020-04-13 ENCOUNTER — Ambulatory Visit: Payer: BC Managed Care – PPO | Admitting: Allergy

## 2020-04-18 ENCOUNTER — Other Ambulatory Visit: Payer: Self-pay | Admitting: Allergy

## 2020-05-17 ENCOUNTER — Ambulatory Visit (INDEPENDENT_AMBULATORY_CARE_PROVIDER_SITE_OTHER): Payer: BC Managed Care – PPO | Admitting: Psychology

## 2020-05-17 DIAGNOSIS — F320 Major depressive disorder, single episode, mild: Secondary | ICD-10-CM | POA: Diagnosis not present

## 2020-05-26 ENCOUNTER — Ambulatory Visit (INDEPENDENT_AMBULATORY_CARE_PROVIDER_SITE_OTHER): Payer: BC Managed Care – PPO | Admitting: Psychology

## 2020-05-26 DIAGNOSIS — F320 Major depressive disorder, single episode, mild: Secondary | ICD-10-CM | POA: Diagnosis not present

## 2020-06-01 ENCOUNTER — Ambulatory Visit (INDEPENDENT_AMBULATORY_CARE_PROVIDER_SITE_OTHER): Payer: BC Managed Care – PPO | Admitting: Psychology

## 2020-06-01 DIAGNOSIS — F320 Major depressive disorder, single episode, mild: Secondary | ICD-10-CM

## 2020-06-15 ENCOUNTER — Ambulatory Visit: Payer: BC Managed Care – PPO | Admitting: Psychology

## 2020-06-22 ENCOUNTER — Ambulatory Visit (INDEPENDENT_AMBULATORY_CARE_PROVIDER_SITE_OTHER): Payer: BC Managed Care – PPO | Admitting: Psychology

## 2020-06-22 DIAGNOSIS — F320 Major depressive disorder, single episode, mild: Secondary | ICD-10-CM

## 2020-06-29 ENCOUNTER — Ambulatory Visit: Payer: BC Managed Care – PPO | Admitting: Psychology

## 2020-07-06 ENCOUNTER — Ambulatory Visit: Payer: BC Managed Care – PPO | Admitting: Psychology

## 2020-07-13 ENCOUNTER — Ambulatory Visit: Payer: BC Managed Care – PPO | Admitting: Psychology

## 2020-07-20 ENCOUNTER — Ambulatory Visit (INDEPENDENT_AMBULATORY_CARE_PROVIDER_SITE_OTHER): Payer: BC Managed Care – PPO | Admitting: Psychology

## 2020-07-20 DIAGNOSIS — F320 Major depressive disorder, single episode, mild: Secondary | ICD-10-CM

## 2020-07-27 ENCOUNTER — Ambulatory Visit: Payer: BC Managed Care – PPO | Admitting: Psychology

## 2020-08-03 ENCOUNTER — Ambulatory Visit: Payer: BC Managed Care – PPO | Admitting: Psychology

## 2020-08-10 ENCOUNTER — Ambulatory Visit: Payer: BC Managed Care – PPO | Admitting: Psychology

## 2020-08-17 ENCOUNTER — Ambulatory Visit (INDEPENDENT_AMBULATORY_CARE_PROVIDER_SITE_OTHER): Payer: BC Managed Care – PPO | Admitting: Psychology

## 2020-08-17 DIAGNOSIS — F320 Major depressive disorder, single episode, mild: Secondary | ICD-10-CM | POA: Diagnosis not present

## 2020-08-24 ENCOUNTER — Ambulatory Visit: Payer: BC Managed Care – PPO | Admitting: Psychology

## 2020-08-31 ENCOUNTER — Ambulatory Visit: Payer: BC Managed Care – PPO | Admitting: Psychology

## 2020-09-07 ENCOUNTER — Ambulatory Visit: Payer: BC Managed Care – PPO | Admitting: Psychology

## 2020-09-14 ENCOUNTER — Ambulatory Visit: Payer: BC Managed Care – PPO | Admitting: Psychology

## 2020-09-21 ENCOUNTER — Ambulatory Visit: Payer: BC Managed Care – PPO | Admitting: Psychology

## 2020-09-28 ENCOUNTER — Ambulatory Visit: Payer: BC Managed Care – PPO | Admitting: Psychology

## 2020-10-05 ENCOUNTER — Ambulatory Visit: Payer: BC Managed Care – PPO | Admitting: Psychology

## 2020-10-08 ENCOUNTER — Other Ambulatory Visit: Payer: Self-pay | Admitting: Allergy

## 2020-10-12 ENCOUNTER — Ambulatory Visit: Payer: BC Managed Care – PPO | Admitting: Psychology

## 2020-10-19 ENCOUNTER — Ambulatory Visit: Payer: BC Managed Care – PPO | Admitting: Psychology

## 2020-10-26 ENCOUNTER — Ambulatory Visit: Payer: BC Managed Care – PPO | Admitting: Psychology

## 2020-11-02 ENCOUNTER — Ambulatory Visit: Payer: BC Managed Care – PPO | Admitting: Psychology

## 2020-11-06 ENCOUNTER — Other Ambulatory Visit: Payer: Self-pay | Admitting: Allergy

## 2020-11-09 ENCOUNTER — Ambulatory Visit: Payer: BC Managed Care – PPO | Admitting: Psychology

## 2020-11-16 ENCOUNTER — Ambulatory Visit: Payer: BC Managed Care – PPO | Admitting: Psychology

## 2021-03-31 ENCOUNTER — Other Ambulatory Visit: Payer: Self-pay

## 2021-03-31 ENCOUNTER — Encounter (HOSPITAL_COMMUNITY): Payer: Self-pay | Admitting: Emergency Medicine

## 2021-03-31 ENCOUNTER — Ambulatory Visit (HOSPITAL_COMMUNITY)
Admission: EM | Admit: 2021-03-31 | Discharge: 2021-03-31 | Disposition: A | Discharge status: Home / Self Care | Payer: BC Managed Care – PPO | Attending: Physician Assistant | Admitting: Physician Assistant

## 2021-03-31 DIAGNOSIS — M7989 Other specified soft tissue disorders: Secondary | ICD-10-CM | POA: Diagnosis present

## 2021-03-31 DIAGNOSIS — R609 Edema, unspecified: Secondary | ICD-10-CM | POA: Diagnosis not present

## 2021-03-31 LAB — COMPREHENSIVE METABOLIC PANEL
ALT: 19 U/L (ref 0–44)
AST: 23 U/L (ref 15–41)
Albumin: 4.1 g/dL (ref 3.5–5.0)
Alkaline Phosphatase: 70 U/L (ref 38–126)
Anion gap: 10 (ref 5–15)
BUN: 8 mg/dL (ref 6–20)
CO2: 27 mmol/L (ref 22–32)
Calcium: 9.7 mg/dL (ref 8.9–10.3)
Chloride: 100 mmol/L (ref 98–111)
Creatinine, Ser: 0.83 mg/dL (ref 0.44–1.00)
GFR, Estimated: >60 mL/min (ref >60)
Glucose, Bld: 92 mg/dL (ref 70–99)
Potassium: 3.2 mmol/L — ABNORMAL LOW (ref 3.5–5.1)
Sodium: 137 mmol/L (ref 135–145)
Total Bilirubin: 0.6 mg/dL (ref 0.3–1.2)
Total Protein: 7.3 g/dL (ref 6.5–8.1)

## 2021-03-31 LAB — CBC
HCT: 45.9 % (ref 36.0–46.0)
Hemoglobin: 16 g/dL — ABNORMAL HIGH (ref 12.0–15.0)
MCH: 32.1 pg (ref 26.0–34.0)
MCHC: 34.9 g/dL (ref 30.0–36.0)
MCV: 92 fL (ref 80.0–100.0)
Platelets: 277 10*3/uL (ref 150–400)
RBC: 4.99 MIL/uL (ref 3.87–5.11)
RDW: 15 % (ref 11.5–15.5)
WBC: 9.4 10*3/uL (ref 4.0–10.5)
nRBC: 0 % (ref 0.0–0.2)

## 2021-03-31 LAB — POCT URINALYSIS DIPSTICK, ED / UC
Bilirubin Urine: NEGATIVE
Glucose, UA: NEGATIVE mg/dL
Hgb urine dipstick: NEGATIVE
Ketones, ur: NEGATIVE mg/dL
Leukocytes,Ua: NEGATIVE
Nitrite: NEGATIVE
Protein, ur: NEGATIVE mg/dL
Specific Gravity, Urine: 1.01 (ref 1.005–1.030)
Urobilinogen, UA: 0.2 mg/dL (ref 0.0–1.0)
pH: 5 (ref 5.0–8.0)

## 2021-03-31 LAB — TSH: TSH: 1.711 u[IU]/mL (ref 0.350–4.500)

## 2021-03-31 LAB — BRAIN NATRIURETIC PEPTIDE: B Natriuretic Peptide: 9.5 pg/mL (ref 0.0–100.0)

## 2021-03-31 MED ORDER — MEDICAL COMPRESSION STOCKINGS MISC: 0 refills | Status: DC

## 2021-03-31 NOTE — ED Provider Notes (Signed)
[place]    CSN: [id number] Arrival date & time: 03/31/21  1737      History   Chief Complaint Chief Complaint  Patient presents with   Leg Swelling    HPI MORENIKE PERCELL is a 50 y.o. female.   Patient presents today with a 2-week history of intermittent leg swelling.  Reports symptoms are worse after she has been on her feet all day and then improve overnight when she keeps her legs elevated.  She denies any significant pain and reports that swelling is localized to bilateral ankles without spread.  She is prescribed hydrochlorothiazide and has been taking this medication as prescribed without missing doses but continues to have swelling.  She denies any significant weight gain.  Denies any sudden increase in sodium consumption.  Denies history of heart failure, thyroid condition, lymphedema, pelvic dissection, chronic liver or kidney disease.  She denies any shortness of breath but does report some increased fatigue but is unsure if this is related.  Denies any chest pain, palpitations, nausea, vomiting, headache, dizziness.   Past Medical History:  Diagnosis Date   Anxiety    Arthritis    wrist, knee   Edema    IBS (irritable bowel syndrome)    Seasonal allergies     Patient Active Problem List   Diagnosis Date Noted   Perimenopause 07/26/2016   Esophageal reflux    Dyspepsia 11/09/2015   Smoker 05/14/2013    Past Surgical History:  Procedure Laterality Date   CESAREAN SECTION     X2   COLONOSCOPY N/A 12/06/2015   Procedure: COLONOSCOPY;  Surgeon: Danie Binder, MD;  [facility];  Service: Endoscopy;  Laterality: N/A;  1145 - moved to 12:00 - office to notify   ESOPHAGOGASTRODUODENOSCOPY N/A 12/06/2015   Procedure: ESOPHAGOGASTRODUODENOSCOPY (EGD);  Surgeon: Danie Binder, MD;  [facility];  Service: Endoscopy;  Laterality: N/A;   HYSTEROSCOPY WITH RESECTOSCOPE N/A 01/17/2016   Procedure: HYSTEROSCOPY WITH RESECTOSCOPIC  POLYPECTOMY WITH MYOSURE;  Surgeon: [name], MD;  Location: [facility];  Service: Gynecology;  Laterality: N/A;   TONSILLECTOMY     TONSILLECTOMY AND ADENOIDECTOMY     denies adenoids just tonsillectomy   TUBAL LIGATION      OB History     Gravida  3   Para  2   Term      Preterm      AB  1   Living  2      SAB      IAB      Ectopic      Multiple      Live Births               Home Medications    Prior to Admission medications   Medication Sig Start Date End Date Taking? Authorizing Provider  albuterol (VENTOLIN HFA) 108 (90 Base) MCG/ACT inhaler INHALE 2 PUFFS INTO THE LUNGS EVERY 4 (FOUR) HOURS AS NEEDED FOR WHEEZING OR SHORTNESS OF BREATH. 10/10/20   Kennith Gain, MD  hydrochlorothiazide (HYDRODIURIL) 25 MG tablet Take 50 mg by mouth daily.   Yes [provider]  Ascorbic Acid (VITAMIN C) 1000 MG tablet Take 1,000 mg by mouth daily.    [provider]  b complex vitamins tablet Take 1 tablet by mouth daily.    [provider]  cholecalciferol (VITAMIN D3) 25 MCG (1000 UNIT) tablet Take 1,000 Units by mouth daily.    [provider]  clonazePAM (KLONOPIN) 0.5 MG tablet Take 0.5 mg by mouth daily as needed for anxiety.  10/13/15 09/24/16  [provider]  clonazePAM (KLONOPIN) 1 MG tablet Take 1 mg by mouth 2 (two) times daily as needed for anxiety.    [provider]  Elastic Bandages & Supports (MEDICAL COMPRESSION STOCKINGS) MISC Use compression stockings throughout the day. 03/31/21   Mindi Akerson, Derry Skill, PA-C  fexofenadine (ALLEGRA) 180 MG tablet TAKE 1 TABLET BY MOUTH EVERY DAY 09/23/17   Kennith Gain, MD  fluconazole (DIFLUCAN) 150 MG tablet Take 1 tablet (150 mg total) by mouth daily. Patient not taking: Reported on 10/15/2019 11/10/18   Princess Bruins, MD  ipratropium (ATROVENT) 0.06 % nasal spray PLACE 2 SPRAYS INTO BOTH NOSTRILS 3 (THREE) TIMES DAILY. 04/18/20   Kennith Gain, MD  montelukast (SINGULAIR) 10 MG tablet TAKE 1 TABLET BY MOUTH EVERYDAY AT BEDTIME 04/07/20   Kennith Gain, MD  Multiple Vitamin (MULTIVITAMIN WITH MINERALS) TABS tablet Take 1 tablet by mouth daily.    [provider]  pantoprazole (PROTONIX) 40 MG tablet Take 1 tablet (40 mg total) by mouth daily. 07/29/17   Annitta Needs, NP  predniSONE (DELTASONE) 10 MG tablet Begin with 6 tabs on day 1, 5 tab on day 2, 4 tab on day 3, 3 tab on day 4, 2 tab on day 5, 1 tab on day 6-take with food 02/17/20   Wieters, [place] C, PA-C  Probiotic Product (RESTORA) CAPS TAKE 1 CAPSULE BY MOUTH DAILY. 11/26/16   Annitta Needs, NP  tiZANidine (ZANAFLEX) 4 MG tablet Take 1 tablet (4 mg total) by mouth every 6 (six) hours as needed for muscle spasms. 02/17/20   Wieters, Hallie C, PA-C  traMADol (ULTRAM) 50 MG tablet Take 1 tablet (50 mg total) by mouth every 6 (six) hours as needed. 02/17/20   Wieters, Hallie C, PA-C  potassium chloride (MICRO-K) 10 MEQ CR capsule Take 10 mEq by mouth daily as needed (low potassium).   02/17/20  [provider]  potassium chloride SA (K-DUR,KLOR-CON) 20 MEQ tablet Take by mouth. 09/17/18 02/17/20  [provider]    Family History Family History  Problem Relation Age of Onset   Hypertension Mother    Diabetes Sister    Colon cancer Neg Hx    Allergic rhinitis Neg Hx    Asthma Neg Hx    Angioedema Neg Hx    Atopy Neg Hx    Eczema Neg Hx    Immunodeficiency Neg Hx    Urticaria Neg Hx     Social History Social History   Tobacco Use   Smoking status: Every Day    Packs/day: 0.50    Years: 10.00    Pack years: 5.00    Types: Cigarettes   Smokeless tobacco: Never  Vaping Use   Vaping Use: Never used  Substance Use Topics   Alcohol use: Yes    Alcohol/week: 0.0 standard drinks    Comment: occasionally on weekends    Drug use: No     Allergies   Blue dyes (parenteral) and Pollen extract   Review of Systems Review of  Systems  Constitutional:  Positive for fatigue. Negative for activity change, appetite change and fever.  Respiratory:  Negative for cough and shortness of breath.   Cardiovascular:  Positive for leg swelling. Negative for chest pain and palpitations.  Gastrointestinal:  Negative for abdominal pain, diarrhea, nausea and vomiting.  Musculoskeletal:  Positive for  myalgias. Negative for arthralgias.  Neurological:  Negative for dizziness, light-headedness and headaches.    Physical Exam Triage Vital Signs ED Triage Vitals  Enc Vitals Group     BP 03/31/21 1842 139/86     Pulse Rate 03/31/21 1842 71     Resp 03/31/21 1842 16     Temp 03/31/21 1842 98.2 F (36.8 C)     Temp Source 03/31/21 1842 Oral     SpO2 03/31/21 1842 96 %     Weight --      Height --      Head Circumference --      Peak Flow --      Pain Score 03/31/21 1846 0     Pain Loc --      Pain Edu? --      Excl. in [place]? --    No data found.  Updated Vital Signs BP 139/86 (BP Location: Left Arm)   Pulse 71   Temp 98.2 F (36.8 C) (Oral)   Resp 16   SpO2 96%   Visual Acuity Right Eye Distance:   Left Eye Distance:   Bilateral Distance:    Right Eye Near:   Left Eye Near:    Bilateral Near:     Physical Exam Vitals reviewed.  Constitutional:      General: She is awake. She is not in acute distress.    Appearance: Normal appearance. She is normal weight. She is not ill-appearing.     Comments: Very pleasant female appears stated age in no acute distress  HENT:     Head: Normocephalic and atraumatic.  Cardiovascular:     Rate and Rhythm: Normal rate and regular rhythm.     Pulses:          Posterior tibial pulses are 2+ on the right side and 2+ on the left side.     Heart sounds: Normal heart sounds, S1 normal and S2 normal. No murmur heard.    Comments: Trace edema noted bilateral ankles.  Negative Homans signs bilaterally.  Calves measure 42 cm bilaterally. Pulmonary:     Effort: Pulmonary effort is  normal.     Breath sounds: Normal breath sounds. No wheezing, rhonchi or rales.     Comments: Clear to auscultation bilaterally Abdominal:     General: Bowel sounds are normal.     Palpations: Abdomen is soft.     Tenderness: There is no abdominal tenderness. There is no right CVA tenderness, left CVA tenderness, guarding or rebound.  Musculoskeletal:     Right lower leg: No edema.     Left lower leg: No edema.  Psychiatric:        Behavior: Behavior is cooperative.     UC Treatments / Results  Labs (all labs ordered are listed, but only abnormal results are displayed) Labs Reviewed  CBC  COMPREHENSIVE METABOLIC PANEL  TSH  BRAIN NATRIURETIC PEPTIDE  POCT URINALYSIS DIPSTICK, ED / UC    EKG   Radiology No results found.  Procedures Procedures (including critical care time)  Medications Ordered in UC Medications - No data to display  Initial Impression / Assessment and Plan / UC Course  I have reviewed the triage vital signs and the nursing notes.  Pertinent labs & imaging results that were available during my care of the patient were reviewed by me and considered in my medical decision making (see chart for details).     Basic labs obtained today including CBC, CMP, TSH, BNP to  investigate potential causes of edema.  Patient has noticed a slightly larger left leg which does appear slightly larger on exam, however, measurements are equal and we discussed low suspicion for DVT given clinical presentation.  Patient is very concerned for DVT so ultrasound was ordered of left leg despite low suspicion per patient request.  She was encouraged to continue hydrochlorothiazide as prescribed.  Recommended she use elevation and compression for symptom relief.  Recommended she avoid sodium.  Discussed that if she has any alarm symptoms she needs to go to the emergency room.  Strict return precautions given to which patient expressed understanding.   Final Clinical Impressions(s) /  UC Diagnoses   Final diagnoses:  Leg swelling  Dependent edema  Left leg swelling     Discharge Instructions      Your urine was normal with no evidence of using protein.  We will contact you if your lab work is abnormal.  As we discussed, I think your symptoms are related to dependent edema.  Please keep your legs elevated is much as possible and avoid sodium.  Use compression stockings as we discussed.  Follow-up with your primary care provider as you may need to consider seeing a vascular surgeon if symptoms persist.  If you develop any shortness of breath or additional symptoms you need to be seen immediately.     ED Prescriptions     Medication Sig Dispense Auth. Provider   Elastic Bandages & Supports (MEDICAL COMPRESSION STOCKINGS) MISC  (Status: Discontinued) Use compression stockings throughout the day. 1 each Trequan Marsolek, Derry Skill, PA-C   Elastic Bandages & Supports (MEDICAL COMPRESSION STOCKINGS) MISC Use compression stockings throughout the day. 1 each Arsh Feutz, Derry Skill, PA-C      PDMP not reviewed this encounter.   Terrilee Croak, PA-C 03/31/21 1938

## 2021-03-31 NOTE — Discharge Instructions (Addendum)
Your urine was normal with no evidence of using protein.  We will contact you if your lab work is abnormal.  As we discussed, I think your symptoms are related to dependent edema.  Please keep your legs elevated is much as possible and avoid sodium.  Use compression stockings as we discussed.  Follow-up with your primary care provider as you may need to consider seeing a vascular surgeon if symptoms persist.  If you develop any shortness of breath or additional symptoms you need to be seen immediately.

## 2021-03-31 NOTE — ED Triage Notes (Signed)
Patient states over the last two weeks her ankles/legs have been more swollen and haven't decreased in size. States she's on a diuretic, and is questioning whether that needs to be increased. Reports being on her feet a lot at work. Denies chest pain, SOB, cough, fever. Swelling visible on inspection

## 2021-05-04 ENCOUNTER — Other Ambulatory Visit: Payer: Self-pay | Admitting: Family Medicine

## 2021-05-04 DIAGNOSIS — Z1231 Encounter for screening mammogram for malignant neoplasm of breast: Secondary | ICD-10-CM

## 2021-07-26 ENCOUNTER — Inpatient Hospital Stay: Admission: RE | Admit: 2021-07-26 | Payer: BC Managed Care – PPO | Source: Ambulatory Visit

## 2021-08-25 ENCOUNTER — Ambulatory Visit
Admission: RE | Admit: 2021-08-25 | Discharge: 2021-08-25 | Disposition: A | Discharge status: Home / Self Care | Payer: BC Managed Care – PPO | Source: Ambulatory Visit | Attending: Family Medicine | Admitting: Family Medicine

## 2021-08-25 ENCOUNTER — Other Ambulatory Visit: Payer: Self-pay

## 2021-08-25 DIAGNOSIS — Z1231 Encounter for screening mammogram for malignant neoplasm of breast: Secondary | ICD-10-CM

## 2022-01-18 NOTE — Progress Notes (Deleted)
51 y.o. [id number] Single {Race/ethnicity:17218} female here for annual exam.    PCP:     Patient's last menstrual period was 05/26/2016.           Sexually active: {yes no:314532}  The current method of family planning is tubal ligation.    Exercising: {yes no:314532}  {types:19826} Smoker:  {YES NO:22349}  Health Maintenance: Pap:  09-26-18 Neg:Neg HR HPV, 07-26-16 Neg:Neg HR HPV, 05-14-13 Neg:Pos HR HPV;Neg 16/18/45 History of abnormal Pap:  Yes, 05-14-13 Neg:Pos HR HPV;Neg 16/18/45 MMG:  08-25-21 Neg/BiRads1 Colonoscopy:  11/2015;next 10 years BMD:   *** n/a Result  ***n/a TDaP:  *** Gardasil:   ***n/a HIV: 09-26-18 NR Hep C: 09-26-18 Neg Screening Labs:  Hb today: ***, Urine today: ***   reports that she has been smoking cigarettes. She has a 5.00 pack-year smoking history. She has never used smokeless tobacco. She reports current alcohol use. She reports that she does not use drugs.  Past Medical History:  Diagnosis Date   Anxiety    Arthritis    wrist, knee   Edema    IBS (irritable bowel syndrome)    Seasonal allergies     Past Surgical History:  Procedure Laterality Date   CESAREAN SECTION     X2   COLONOSCOPY N/A 12/06/2015   Procedure: COLONOSCOPY;  Surgeon: Danie Binder, MD;  Location: [facility];  Service: Endoscopy;  Laterality: N/A;  1145 - moved to 12:00 - office to notify   ESOPHAGOGASTRODUODENOSCOPY N/A 12/06/2015   Procedure: ESOPHAGOGASTRODUODENOSCOPY (EGD);  Surgeon: Danie Binder, MD;  Location: [facility];  Service: Endoscopy;  Laterality: N/A;   HYSTEROSCOPY WITH RESECTOSCOPE N/A 01/17/2016   Procedure: HYSTEROSCOPY WITH RESECTOSCOPIC POLYPECTOMY WITH MYOSURE;  Surgeon: [name], MD;  Location: [facility];  Service: Gynecology;  Laterality: N/A;   TONSILLECTOMY     TONSILLECTOMY AND ADENOIDECTOMY     denies adenoids just tonsillectomy   TUBAL LIGATION      Current Outpatient Medications  Medication Sig Dispense Refill   albuterol (VENTOLIN HFA)  108 (90 Base) MCG/ACT inhaler INHALE 2 PUFFS INTO THE LUNGS EVERY 4 (FOUR) HOURS AS NEEDED FOR WHEEZING OR SHORTNESS OF BREATH. 8 each 0   Ascorbic Acid (VITAMIN C) 1000 MG tablet Take 1,000 mg by mouth daily.     b complex vitamins tablet Take 1 tablet by mouth daily.     cholecalciferol (VITAMIN D3) 25 MCG (1000 UNIT) tablet Take 1,000 Units by mouth daily.     clonazePAM (KLONOPIN) 0.5 MG tablet Take 0.5 mg by mouth daily as needed for anxiety.      clonazePAM (KLONOPIN) 1 MG tablet Take 1 mg by mouth 2 (two) times daily as needed for anxiety.     Elastic Bandages & Supports (MEDICAL COMPRESSION STOCKINGS) MISC Use compression stockings throughout the day. 1 each 0   fexofenadine (ALLEGRA) 180 MG tablet TAKE 1 TABLET BY MOUTH EVERY DAY 30 tablet 1   fluconazole (DIFLUCAN) 150 MG tablet Take 1 tablet (150 mg total) by mouth daily. (Patient not taking: Reported on 10/15/2019) 3 tablet 0   hydrochlorothiazide (HYDRODIURIL) 25 MG tablet Take 50 mg by mouth daily.     ipratropium (ATROVENT) 0.06 % nasal spray PLACE 2 SPRAYS INTO BOTH NOSTRILS 3 (THREE) TIMES DAILY. 15 mL 0   montelukast (SINGULAIR) 10 MG tablet TAKE 1 TABLET BY MOUTH EVERYDAY AT BEDTIME 90 tablet 1   Multiple Vitamin (MULTIVITAMIN WITH MINERALS) TABS tablet Take 1 tablet by mouth  daily.     pantoprazole (PROTONIX) 40 MG tablet Take 1 tablet (40 mg total) by mouth daily. 90 tablet 3   predniSONE (DELTASONE) 10 MG tablet Begin with 6 tabs on day 1, 5 tab on day 2, 4 tab on day 3, 3 tab on day 4, 2 tab on day 5, 1 tab on day 6-take with food 21 tablet 0   Probiotic Product (RESTORA) CAPS TAKE 1 CAPSULE BY MOUTH DAILY. 90 capsule 2   tiZANidine (ZANAFLEX) 4 MG tablet Take 1 tablet (4 mg total) by mouth every 6 (six) hours as needed for muscle spasms. 30 tablet 0   traMADol (ULTRAM) 50 MG tablet Take 1 tablet (50 mg total) by mouth every 6 (six) hours as needed. 12 tablet 0   No current facility-administered medications for this visit.     Family History  Problem Relation Age of Onset   Hypertension Mother    Diabetes Sister    Colon cancer Neg Hx    Allergic rhinitis Neg Hx    Asthma Neg Hx    Angioedema Neg Hx    Atopy Neg Hx    Eczema Neg Hx    Immunodeficiency Neg Hx    Urticaria Neg Hx     Review of Systems  Exam:   LMP 05/26/2016     General appearance: alert, cooperative and appears stated age Head: normocephalic, without obvious abnormality, atraumatic Neck: no adenopathy, supple, symmetrical, trachea midline and thyroid normal to inspection and palpation Lungs: clear to auscultation bilaterally Breasts: normal appearance, no masses or tenderness, No nipple retraction or dimpling, No nipple discharge or bleeding, No axillary adenopathy Heart: regular rate and rhythm Abdomen: soft, non-tender; no masses, no organomegaly Extremities: extremities normal, atraumatic, no cyanosis or edema Skin: skin color, texture, turgor normal. No rashes or lesions Lymph nodes: cervical, supraclavicular, and axillary nodes normal. Neurologic: grossly normal  Pelvic: External genitalia:  no lesions              No abnormal inguinal nodes palpated.              Urethra:  normal appearing urethra with no masses, tenderness or lesions              Bartholins and Skenes: normal                 Vagina: normal appearing vagina with normal color and discharge, no lesions              Cervix: no lesions              Pap taken: {yes no:314532} Bimanual Exam:  Uterus:  normal size, contour, position, consistency, mobility, non-tender              Adnexa: no mass, fullness, tenderness              Rectal exam: {yes no:314532}.  Confirms.              Anus:  normal sphincter tone, no lesions  Chaperone was present for exam:  ***  Assessment:   Well woman visit with gynecologic exam.   Plan: Mammogram screening discussed. Self breast awareness reviewed. Pap and HR HPV as above. Guidelines for Calcium, Vitamin D, regular  exercise program including cardiovascular and weight bearing exercise.   Follow up annually and prn.   Additional counseling given.  {yes [id number]. _______ minutes face to face time of which over 50% was spent in counseling.    After  visit summary provided.

## 2022-01-22 ENCOUNTER — Encounter: Payer: Self-pay | Admitting: Obstetrics and Gynecology

## 2022-01-25 DIAGNOSIS — F331 Major depressive disorder, recurrent, moderate: Secondary | ICD-10-CM | POA: Diagnosis not present

## 2022-01-25 DIAGNOSIS — F5105 Insomnia due to other mental disorder: Secondary | ICD-10-CM | POA: Diagnosis not present

## 2022-01-25 DIAGNOSIS — F411 Generalized anxiety disorder: Secondary | ICD-10-CM | POA: Diagnosis not present

## 2022-07-05 DIAGNOSIS — R030 Elevated blood-pressure reading, without diagnosis of hypertension: Secondary | ICD-10-CM | POA: Diagnosis not present

## 2022-07-07 DIAGNOSIS — Z23 Encounter for immunization: Secondary | ICD-10-CM | POA: Diagnosis not present

## 2022-07-07 DIAGNOSIS — R5383 Other fatigue: Secondary | ICD-10-CM | POA: Diagnosis not present

## 2022-07-07 DIAGNOSIS — F419 Anxiety disorder, unspecified: Secondary | ICD-10-CM | POA: Diagnosis not present

## 2022-07-07 DIAGNOSIS — R229 Localized swelling, mass and lump, unspecified: Secondary | ICD-10-CM | POA: Diagnosis not present

## 2022-07-07 DIAGNOSIS — I10 Essential (primary) hypertension: Secondary | ICD-10-CM | POA: Diagnosis not present

## 2022-07-25 DIAGNOSIS — R102 Pelvic and perineal pain: Secondary | ICD-10-CM | POA: Diagnosis not present

## 2022-07-25 DIAGNOSIS — I10 Essential (primary) hypertension: Secondary | ICD-10-CM | POA: Diagnosis not present

## 2022-07-25 DIAGNOSIS — Z113 Encounter for screening for infections with a predominantly sexual mode of transmission: Secondary | ICD-10-CM | POA: Diagnosis not present

## 2022-07-25 DIAGNOSIS — Z114 Encounter for screening for human immunodeficiency virus [HIV]: Secondary | ICD-10-CM | POA: Diagnosis not present

## 2022-07-25 DIAGNOSIS — Z01419 Encounter for gynecological examination (general) (routine) without abnormal findings: Secondary | ICD-10-CM | POA: Diagnosis not present

## 2022-07-25 DIAGNOSIS — Z1159 Encounter for screening for other viral diseases: Secondary | ICD-10-CM | POA: Diagnosis not present

## 2022-08-04 DIAGNOSIS — E669 Obesity, unspecified: Secondary | ICD-10-CM | POA: Diagnosis not present

## 2022-08-04 DIAGNOSIS — I10 Essential (primary) hypertension: Secondary | ICD-10-CM | POA: Diagnosis not present

## 2022-08-04 DIAGNOSIS — Z6839 Body mass index (BMI) 39.0-39.9, adult: Secondary | ICD-10-CM | POA: Diagnosis not present

## 2022-08-15 ENCOUNTER — Encounter: Payer: Self-pay | Admitting: Cardiology

## 2022-08-23 DIAGNOSIS — R222 Localized swelling, mass and lump, trunk: Secondary | ICD-10-CM | POA: Diagnosis not present

## 2022-09-12 DIAGNOSIS — R102 Pelvic and perineal pain: Secondary | ICD-10-CM | POA: Diagnosis not present

## 2022-09-19 DIAGNOSIS — S39012A Strain of muscle, fascia and tendon of lower back, initial encounter: Secondary | ICD-10-CM | POA: Diagnosis not present

## 2022-09-22 DIAGNOSIS — E876 Hypokalemia: Secondary | ICD-10-CM | POA: Diagnosis not present

## 2022-09-22 DIAGNOSIS — D171 Benign lipomatous neoplasm of skin and subcutaneous tissue of trunk: Secondary | ICD-10-CM | POA: Diagnosis not present

## 2022-09-22 DIAGNOSIS — I10 Essential (primary) hypertension: Secondary | ICD-10-CM | POA: Diagnosis not present

## 2022-10-03 DIAGNOSIS — S39012D Strain of muscle, fascia and tendon of lower back, subsequent encounter: Secondary | ICD-10-CM | POA: Diagnosis not present

## 2022-10-24 ENCOUNTER — Ambulatory Visit
Admission: EM | Admit: 2022-10-24 | Discharge: 2022-10-24 | Disposition: A | Discharge status: Home / Self Care | Payer: BC Managed Care – PPO

## 2022-10-24 DIAGNOSIS — K047 Periapical abscess without sinus: Secondary | ICD-10-CM | POA: Diagnosis not present

## 2022-10-24 DIAGNOSIS — I10 Essential (primary) hypertension: Secondary | ICD-10-CM | POA: Diagnosis not present

## 2022-10-24 DIAGNOSIS — R519 Headache, unspecified: Secondary | ICD-10-CM

## 2022-10-24 HISTORY — DX: Essential (primary) hypertension: I10

## 2022-10-24 MED ORDER — AMOXICILLIN-POT CLAVULANATE 875-125 MG PO TABS: 1.0000 | ORAL_TABLET | Freq: Two times a day (BID) | ORAL | 0 refills | Status: DC

## 2022-10-24 MED ORDER — ACETAMINOPHEN 325 MG PO TABS: 650.0000 mg | ORAL_TABLET | Freq: Four times a day (QID) | ORAL | 0 refills | Status: DC | PRN

## 2022-10-24 MED ORDER — HYDROCODONE-ACETAMINOPHEN 5-325 MG PO TABS: 1.0000 | ORAL_TABLET | Freq: Four times a day (QID) | ORAL | 0 refills | Status: DC | PRN

## 2022-10-24 NOTE — ED Triage Notes (Signed)
Pt c/o lower right dental pain-states a filling fell out-was seen by dentist Dec 2023-she is awaiting appt with oral surgeon-NAD-steady gait

## 2022-10-24 NOTE — Discharge Instructions (Addendum)
Please schedule Tylenol twice daily with food for your severe pain.  If you still have pain despite taking Tylenol regularly, this is breakthrough pain.  You can use hydrocodone, a narcotic pain medicine, once every 4-6 hours for this.  Once your pain is better controlled, switch back to just Tylenol.   Keep taking your blood pressure medications. Stop using BC powders. Follow up with your dental surgeon as soon as possible.

## 2022-10-24 NOTE — ED Provider Notes (Signed)
[facility]  Note:  This document was prepared using Systems analyst and may include unintentional dictation errors.  MRN: [id number] DOB: 1971-07-20  Subjective:   Ashley Bailey is a 52 y.o. female presenting for severe right-sided dental pain, facial pain.  Patient has been working with her dentist, but was found to have significant periodontal disease.  She is supposed to see a Buyer, retail and is waiting on an appointment.  They were unwilling to see her back today to get her pain medication or start her on antibiotics.  Regarding her blood pressure, patient is compliant.  Reports that it is likely elevated due to her pain.  She is also using BC powders regularly and daily.  No current facility-administered medications for this encounter.  Current Outpatient Medications:    albuterol (VENTOLIN HFA) 108 (90 Base) MCG/ACT inhaler, INHALE 2 PUFFS INTO THE LUNGS EVERY 4 (FOUR) HOURS AS NEEDED FOR WHEEZING OR SHORTNESS OF BREATH., Disp: 8 each, Rfl: 0   furosemide (LASIX) 20 MG tablet, Take by mouth., Disp: , Rfl:    Ascorbic Acid (VITAMIN C) 1000 MG tablet, Take 1,000 mg by mouth daily., Disp: , Rfl:    b complex vitamins tablet, Take 1 tablet by mouth daily., Disp: , Rfl:    cholecalciferol (VITAMIN D3) 25 MCG (1000 UNIT) tablet, Take 1,000 Units by mouth daily., Disp: , Rfl:    clonazePAM (KLONOPIN) 0.5 MG tablet, Take 0.5 mg by mouth daily as needed for anxiety. , Disp: , Rfl:    clonazePAM (KLONOPIN) 1 MG tablet, Take 1 mg by mouth 2 (two) times daily as needed for anxiety., Disp: , Rfl:    Elastic Bandages & Supports (MEDICAL COMPRESSION STOCKINGS) MISC, Use compression stockings throughout the day., Disp: 1 each, Rfl: 0   fexofenadine (ALLEGRA) 180 MG tablet, TAKE 1 TABLET BY MOUTH EVERY DAY, Disp: 30 tablet, Rfl: 1   fluconazole (DIFLUCAN) 150 MG tablet, Take 1 tablet (150 mg total) by mouth daily. (Patient not taking: Reported on  10/15/2019), Disp: 3 tablet, Rfl: 0   hydrochlorothiazide (HYDRODIURIL) 25 MG tablet, Take 50 mg by mouth daily., Disp: , Rfl:    ipratropium (ATROVENT) 0.06 % nasal spray, PLACE 2 SPRAYS INTO BOTH NOSTRILS 3 (THREE) TIMES DAILY., Disp: 15 mL, Rfl: 0   losartan (COZAAR) 25 MG tablet, Take 1 tablet by mouth daily., Disp: , Rfl:    montelukast (SINGULAIR) 10 MG tablet, TAKE 1 TABLET BY MOUTH EVERYDAY AT BEDTIME, Disp: 90 tablet, Rfl: 1   Multiple Vitamin (MULTIVITAMIN WITH MINERALS) TABS tablet, Take 1 tablet by mouth daily., Disp: , Rfl:    pantoprazole (PROTONIX) 40 MG tablet, Take 1 tablet (40 mg total) by mouth daily., Disp: 90 tablet, Rfl: 3   predniSONE (DELTASONE) 10 MG tablet, Begin with 6 tabs on day 1, 5 tab on day 2, 4 tab on day 3, 3 tab on day 4, 2 tab on day 5, 1 tab on day 6-take with food, Disp: 21 tablet, Rfl: 0   Probiotic Product (RESTORA) CAPS, TAKE 1 CAPSULE BY MOUTH DAILY., Disp: 90 capsule, Rfl: 2   tiZANidine (ZANAFLEX) 4 MG tablet, Take 1 tablet (4 mg total) by mouth every 6 (six) hours as needed for muscle spasms., Disp: 30 tablet, Rfl: 0   traMADol (ULTRAM) 50 MG tablet, Take 1 tablet (50 mg total) by mouth every 6 (six) hours as needed., Disp: 12 tablet, Rfl: 0   Allergies  Allergen Reactions  Blue Dyes (Parenteral)    Pollen Extract     Past Medical History:  Diagnosis Date   Anxiety    Arthritis    wrist, knee   Edema    Hypertension    IBS (irritable bowel syndrome)    Seasonal allergies      Past Surgical History:  Procedure Laterality Date   CESAREAN SECTION     X2   COLONOSCOPY N/A 12/06/2015   Procedure: COLONOSCOPY;  Surgeon: Danie Binder, MD;  Location: [facility];  Service: Endoscopy;  Laterality: N/A;  1145 - moved to 12:00 - office to notify   ESOPHAGOGASTRODUODENOSCOPY N/A 12/06/2015   Procedure: ESOPHAGOGASTRODUODENOSCOPY (EGD);  Surgeon: Danie Binder, MD;  Location: [facility];  Service: Endoscopy;  Laterality: N/A;   HYSTEROSCOPY  WITH RESECTOSCOPE N/A 01/17/2016   Procedure: HYSTEROSCOPY WITH RESECTOSCOPIC POLYPECTOMY WITH MYOSURE;  Surgeon: [name], MD;  Location: [facility];  Service: Gynecology;  Laterality: N/A;   TONSILLECTOMY     TONSILLECTOMY AND ADENOIDECTOMY     denies adenoids just tonsillectomy   TUBAL LIGATION      Family History  Problem Relation Age of Onset   Hypertension Mother    Diabetes Sister    Colon cancer Neg Hx    Allergic rhinitis Neg Hx    Asthma Neg Hx    Angioedema Neg Hx    Atopy Neg Hx    Eczema Neg Hx    Immunodeficiency Neg Hx    Urticaria Neg Hx     Social History   Tobacco Use   Smoking status: Every Day    Packs/day: 0.50    Years: 10.00    Total pack years: 5.00    Types: Cigarettes   Smokeless tobacco: Never  Vaping Use   Vaping Use: Never used  Substance Use Topics   Alcohol use: Yes    Comment: occ   Drug use: No    ROS   Objective:   Vitals: BP 132/84 (BP Location: Left Arm)   Pulse 83   Temp 98.6 F (37 C) (Oral)   Resp 20   LMP 05/26/2016   SpO2 98%   Physical Exam Constitutional:      General: She is not in acute distress.    Appearance: Normal appearance. She is well-developed. She is not ill-appearing, toxic-appearing or diaphoretic.  HENT:     Head: Normocephalic and atraumatic.     Right Ear: External ear normal.     Left Ear: External ear normal.     Nose: Nose normal.     Mouth/Throat:     Mouth: Mucous membranes are moist.  Eyes:     General: No scleral icterus.       Right eye: No discharge.        Left eye: No discharge.     Extraocular Movements: Extraocular movements intact.  Cardiovascular:     Rate and Rhythm: Normal rate and regular rhythm.     Heart sounds: Normal heart sounds. No murmur heard.    No friction rub. No gallop.  Pulmonary:     Effort: Pulmonary effort is normal. No respiratory distress.     Breath sounds: No stridor. No wheezing, rhonchi or rales.  Chest:     Chest wall: No tenderness.   Skin:    General: Skin is warm and dry.  Neurological:     General: No focal deficit present.     Mental Status: She is alert and oriented to person,  place, and time.     Cranial Nerves: No cranial nerve deficit.     Motor: No weakness.     Coordination: Coordination normal.     Gait: Gait normal.  Psychiatric:        Mood and Affect: Mood normal.        Behavior: Behavior normal.     Assessment and Plan :   I have reviewed the PDMP during this encounter.  1. Dental infection   2. Facial pain   3. Essential hypertension     Start Augmentin for dental infection/abscess, use Tylenol for pain and for breakthrough pain can use hydrocodone. Emphasized need for dental surgeon consult. Counseled patient on potential for adverse effects with medications prescribed/recommended today, strict ER and return-to-clinic precautions discussed, patient verbalized understanding.    Jaynee Eagles, [place] 10/24/22 [id number]

## 2022-12-04 ENCOUNTER — Ambulatory Visit: Payer: BC Managed Care – PPO | Attending: Cardiology | Admitting: Cardiology

## 2022-12-04 ENCOUNTER — Encounter: Payer: Self-pay | Admitting: Cardiology

## 2022-12-04 VITALS — BP 130/78 | HR 82 | Ht 66.0 in | Wt 257.0 lb

## 2022-12-04 DIAGNOSIS — R400 Somnolence: Secondary | ICD-10-CM

## 2022-12-04 DIAGNOSIS — Z131 Encounter for screening for diabetes mellitus: Secondary | ICD-10-CM

## 2022-12-04 DIAGNOSIS — R5383 Other fatigue: Secondary | ICD-10-CM | POA: Diagnosis not present

## 2022-12-04 DIAGNOSIS — I10 Essential (primary) hypertension: Secondary | ICD-10-CM | POA: Diagnosis not present

## 2022-12-04 DIAGNOSIS — R0609 Other forms of dyspnea: Secondary | ICD-10-CM | POA: Diagnosis not present

## 2022-12-04 DIAGNOSIS — Z01812 Encounter for preprocedural laboratory examination: Secondary | ICD-10-CM

## 2022-12-04 DIAGNOSIS — R0683 Snoring: Secondary | ICD-10-CM

## 2022-12-04 DIAGNOSIS — M7989 Other specified soft tissue disorders: Secondary | ICD-10-CM | POA: Diagnosis not present

## 2022-12-04 MED ORDER — METOPROLOL TARTRATE 100 MG PO TABS: ORAL_TABLET | ORAL | 0 refills | Status: DC

## 2022-12-04 NOTE — Progress Notes (Unsigned)
Cardiology Office Note:    Date:  12/05/2022   ID:  Ashley Bailey, DOB Jun 05, 1971, MRN [id number]  PCP:  Verlon Au, MD  Cardiologist:  Thomasene Ripple, DO  Electrophysiologist:  None   Referring MD: Verlon Au, MD   " I am ok"  History of Present Illness:    Ashley Bailey is a 52 y.o. female with a hx of hypertension, IBS, anxiety, arthritis.  She was referred by Dr. Cherly Bailey due to significant bilateral lower extremity edema and the fact that she is on Lasix.  She is recently has been diagnosed with hypertension and was started on losartan but she no longer takes this medicine.  She reports that she had experiencing significant shortness of breath on exertion.  Off-and-on. She denies any chest pain.  But does admit that she have significant fatigue as well as daytime somnolence and she has been told that she snores.   Past Medical History:  Diagnosis Date   Anxiety    Arthritis    wrist, knee   Edema    Hypertension    IBS (irritable bowel syndrome)    Seasonal allergies     Past Surgical History:  Procedure Laterality Date   CESAREAN SECTION     X2   COLONOSCOPY N/A 12/06/2015   Procedure: COLONOSCOPY;  Surgeon: West Bali, MD;  Location: [facility];  Service: Endoscopy;  Laterality: N/A;  1145 - moved to 12:00 - office to notify   ESOPHAGOGASTRODUODENOSCOPY N/A 12/06/2015   Procedure: ESOPHAGOGASTRODUODENOSCOPY (EGD);  Surgeon: West Bali, MD;  Location: [facility];  Service: Endoscopy;  Laterality: N/A;   HYSTEROSCOPY WITH RESECTOSCOPE N/A 01/17/2016   Procedure: HYSTEROSCOPY WITH RESECTOSCOPIC POLYPECTOMY WITH MYOSURE;  Surgeon: Ok Edwards, MD;  Location: [facility];  Service: Gynecology;  Laterality: N/A;   TONSILLECTOMY     TONSILLECTOMY AND ADENOIDECTOMY     denies adenoids just tonsillectomy   TUBAL LIGATION      Current Medications: Current Meds  Medication Sig   Ascorbic Acid (VITAMIN C) 1000 MG tablet Take  1,000 mg by mouth daily.   b complex vitamins tablet Take 1 tablet by mouth daily.   cholecalciferol (VITAMIN D3) 25 MCG (1000 UNIT) tablet Take 1,000 Units by mouth daily.   clonazePAM (KLONOPIN) 1 MG tablet Take 1 mg by mouth 2 (two) times daily as needed for anxiety.   fexofenadine (ALLEGRA) 180 MG tablet TAKE 1 TABLET BY MOUTH EVERY DAY   furosemide (LASIX) 20 MG tablet Take by mouth.   metoprolol tartrate (LOPRESSOR) 100 MG tablet Take 2 hours prior to CT   Multiple Vitamin (MULTIVITAMIN WITH MINERALS) TABS tablet Take 1 tablet by mouth daily.   pantoprazole (PROTONIX) 40 MG tablet Take 1 tablet (40 mg total) by mouth daily.   potassium chloride (KLOR-CON) 10 MEQ tablet Take 10 mEq by mouth daily.   Vitamin A 2400 MCG (8000 UT) CAPS Take by mouth daily at 6 (six) AM.   [DISCONTINUED] acetaminophen (TYLENOL) 325 MG tablet Take 2 tablets (650 mg total) by mouth every 6 (six) hours as needed for moderate pain.   [DISCONTINUED] albuterol (VENTOLIN HFA) 108 (90 Base) MCG/ACT inhaler INHALE 2 PUFFS INTO THE LUNGS EVERY 4 (FOUR) HOURS AS NEEDED FOR WHEEZING OR SHORTNESS OF BREATH.   [DISCONTINUED] amoxicillin-clavulanate (AUGMENTIN) 875-125 MG tablet Take 1 tablet by mouth 2 (two) times daily.   [DISCONTINUED] Elastic Bandages & Supports (MEDICAL COMPRESSION STOCKINGS) MISC Use compression stockings throughout the day.   [  DISCONTINUED] fluconazole (DIFLUCAN) 150 MG tablet Take 1 tablet (150 mg total) by mouth daily.   [DISCONTINUED] hydrochlorothiazide (HYDRODIURIL) 25 MG tablet Take 50 mg by mouth daily.   [DISCONTINUED] HYDROcodone-acetaminophen (NORCO/VICODIN) 5-325 MG tablet Take 1 tablet by mouth every 6 (six) hours as needed for severe pain.   [DISCONTINUED] ipratropium (ATROVENT) 0.06 % nasal spray PLACE 2 SPRAYS INTO BOTH NOSTRILS 3 (THREE) TIMES DAILY.   [DISCONTINUED] losartan (COZAAR) 25 MG tablet Take 1 tablet by mouth daily.   [DISCONTINUED] predniSONE (DELTASONE) 10 MG tablet Begin  with 6 tabs on day 1, 5 tab on day 2, 4 tab on day 3, 3 tab on day 4, 2 tab on day 5, 1 tab on day 6-take with food   [DISCONTINUED] Probiotic Product (RESTORA) CAPS TAKE 1 CAPSULE BY MOUTH DAILY.   [DISCONTINUED] tiZANidine (ZANAFLEX) 4 MG tablet Take 1 tablet (4 mg total) by mouth every 6 (six) hours as needed for muscle spasms.   [DISCONTINUED] traMADol (ULTRAM) 50 MG tablet Take 1 tablet (50 mg total) by mouth every 6 (six) hours as needed.     Allergies:   Bee pollen, Blue dyes (parenteral), Pollen extract, and Powder scent fragrance   Social History   Socioeconomic History   Marital status: Single    Spouse name: Not on file   Number of children: Not on file   Years of education: Not on file   Highest education level: Not on file  Occupational History   Not on file  Tobacco Use   Smoking status: Every Day    Packs/day: 0.50    Years: 10.00    Additional pack years: 0.00    Total pack years: 5.00    Types: Cigarettes   Smokeless tobacco: Never  Vaping Use   Vaping Use: Never used  Substance and Sexual Activity   Alcohol use: Yes    Comment: occ   Drug use: No   Sexual activity: Yes    Partners: Male    Birth control/protection: Surgical    Comment: first sexual enconter at 52 yrs old. More than 5 partners in a lifetime  Other Topics Concern   Not on file  Social History Narrative   Not on file   Social Determinants of Health   Financial Resource Strain: Not on file  Food Insecurity: Not on file  Transportation Needs: Not on file  Physical Activity: Not on file  Stress: Not on file  Social Connections: Not on file     Family History: The patient's family history includes Diabetes in her sister; Hypertension in her mother. There is no history of Colon cancer, Allergic rhinitis, Asthma, Angioedema, Atopy, Eczema, Immunodeficiency, or Urticaria.  ROS:   Review of Systems  Constitution: Negative for decreased appetite, fever and weight gain.  HENT: Negative  for congestion, ear discharge, hoarse voice and sore throat.   Eyes: Negative for discharge, redness, vision loss in right eye and visual halos.  Cardiovascular: Negative for chest pain, dyspnea on exertion, leg swelling, orthopnea and palpitations.  Respiratory: Negative for cough, hemoptysis, shortness of breath and snoring.   Endocrine: Negative for heat intolerance and polyphagia.  Hematologic/Lymphatic: Negative for bleeding problem. Does not bruise/bleed easily.  Skin: Negative for flushing, nail changes, rash and suspicious lesions.  Musculoskeletal: Negative for arthritis, joint pain, muscle cramps, myalgias, neck pain and stiffness.  Gastrointestinal: Negative for abdominal pain, bowel incontinence, diarrhea and excessive appetite.  Genitourinary: Negative for decreased libido, genital sores and incomplete emptying.  Neurological: Negative  for brief paralysis, focal weakness, headaches and loss of balance.  Psychiatric/Behavioral: Negative for altered mental status, depression and suicidal ideas.  Allergic/Immunologic: Negative for HIV exposure and persistent infections.    EKGs/Labs/Other Studies Reviewed:    The following studies were reviewed today:   EKG:  The ekg ordered today demonstrates sinus rhythm, heart rate 82 bpm.  T wave abnormality suggesting anterolateral inferior lateral  wall ischemia.  Recent Labs: 12/04/2022: BUN 8; Creatinine, Ser 0.80; Magnesium 2.3; Potassium 4.6; Sodium 140  Recent Lipid Panel    Component Value Date/Time   CHOL 191 05/14/2013 1018    Physical Exam:    VS:  BP 130/78   Pulse 82   Ht  (1.676 m)   Wt 257 lb (116.6 kg)   LMP 05/26/2016   SpO2 98%   BMI 41.48 kg/m     Wt Readings from Last 3 Encounters:  12/04/22 257 lb (116.6 kg)  02/17/20 220 lb (99.8 kg)  10/15/19 254 lb (115.2 kg)     GEN: Well nourished, well developed in no acute distress HEENT: Normal NECK: No JVD; No carotid bruits LYMPHATICS: No  lymphadenopathy CARDIAC: S1S2 noted,RRR, no murmurs, rubs, gallops RESPIRATORY:  Clear to auscultation without rales, wheezing or rhonchi  ABDOMEN: Soft, non-tender, non-distended, +bowel sounds, no guarding. EXTREMITIES: No edema, No cyanosis, no clubbing MUSCULOSKELETAL:  No deformity  SKIN: Warm and dry NEUROLOGIC:  Alert and oriented x 3, non-focal PSYCHIATRIC:  Normal affect, good insight  ASSESSMENT:    1. Primary hypertension   2. Fatigue, unspecified type   3. DOE (dyspnea on exertion)   4. Leg swelling   5. Snoring   6. Daytime somnolence   7. Pre-procedure lab exam   8. Screening for diabetes mellitus (DM)   9. Morbid obesity    PLAN:     Her blood pressure is in the office today.  No need for any medication adjustment.  In terms of the dyspnea on exertion abnormal EKG it would be best to proceed with an ischemic evaluation in this patient.  The symptoms of dyspnea on exertion is concerning, this patient does have intermediate risk for coronary artery disease and at this time I would like to pursue an ischemic evaluation in this patient.  Shared decision a coronary CTA at this time is appropriate.  I have discussed with the patient about the testing.  The patient has no IV contrast allergy and is agreeable to proceed with this test.   With her fatigue and dyspnea exertion is also appropriate to get an echocardiogram to assess for any structural abnormalities.  Reported daytime somnolence and snoring we will send the patient for sleep study.  Will get blood work today.  The patient understands the need to lose weight with diet and exercise. We have discussed specific strategies for this.  The patient is in agreement with the above plan. The patient left the office in stable condition.  The patient will follow up in 4 months or sooner if needed.   Medication Adjustments/Labs and Tests Ordered: Current medicines are reviewed at length with the patient today.  Concerns  regarding medicines are outlined above.  Orders Placed This Encounter  Procedures   CT CORONARY MORPH W/CTA COR W/SCORE W/CA W/CM &/OR WO/CM   Hemoglobin A1c   Basic Metabolic Panel (BMET)   Magnesium   EKG 12-Lead   ECHOCARDIOGRAM COMPLETE   Split night study   Meds ordered this encounter  Medications   metoprolol tartrate (LOPRESSOR)  100 MG tablet    Sig: Take 2 hours prior to CT    Dispense:  1 tablet    Refill:  0    Patient Instructions  Medication Instructions:  Your physician recommends that you continue on your current medications as directed. Please refer to the Current Medication list given to you today.  *If you need a refill on your cardiac medications before your next appointment, please call your pharmacy*   Lab Work: Your physician recommends that you have labs drawn today: BMET, Mag, HgbA1c If you have labs (blood work) drawn today and your tests are completely normal, you will receive your results only by: MyChart Message (if you have MyChart) OR A paper copy in the mail If you have any lab test that is abnormal or we need to change your treatment, we will call you to review the results   Testing/Procedures: Your physician has requested that you have an echocardiogram. Echocardiography is a painless test that uses sound waves to create images of your heart. It provides your doctor with information about the size and shape of your heart and how well your heart's chambers and valves are working. This procedure takes approximately one hour. There are no restrictions for this procedure. Please do NOT wear cologne, perfume, aftershave, or lotions (deodorant is allowed). Please arrive 15 minutes prior to your appointment time.  Your physician has recommended that you have a sleep study. This test records several body functions during sleep, including: brain activity, eye movement, oxygen and carbon dioxide blood levels, heart rate and rhythm, breathing rate and  rhythm, the flow of air through your mouth and nose, snoring, body muscle movements, and chest and belly movement.    Your cardiac CT will be scheduled at one of the below locations:   [facility] [address] [place], [place] [address] [phone]  If scheduled at [facility], please arrive at the [facility] and Children's Entrance (Entrance C2) of [facility] 30 minutes prior to test start time. You can use the FREE valet parking offered at entrance C (encouraged to control the heart rate for the test)  Proceed to the [facility] Radiology Department (first floor) to check-in and test prep.  All radiology patients and guests should use entrance C2 at [facility], accessed from [facility], even though the hospital's physical address listed is [address].       Please follow these instructions carefully (unless otherwise directed):   On the Night Before the Test: Be sure to Drink plenty of water. Do not consume any caffeinated/decaffeinated beverages or chocolate 12 hours prior to your test. Do not take any antihistamines 12 hours prior to your test.   On the Day of the Test: Drink plenty of water until 1 hour prior to the test. Do not eat any food 1 hour prior to test. You may take your regular medications prior to the test.  Take metoprolol (Lopressor) two hours prior to test. If you take Furosemide/Hydrochlorothiazide/Spironolactone, please HOLD on the morning of the test. FEMALES- please wear underwire-free bra if available, avoid dresses & tight clothing       After the Test: Drink plenty of water. After receiving IV contrast, you may experience a mild flushed feeling. This is normal. On occasion, you may experience a mild rash up to 24 hours after the test. This is not dangerous. If this occurs, you can take Benadryl 25 mg and increase your fluid  intake. If you experience trouble breathing, this can be  serious. If it is severe call 911 IMMEDIATELY. If it is mild, please call our office. If you take any of these medications: Glipizide/Metformin, Avandament, Glucavance, please do not take 48 hours after completing test unless otherwise instructed.  We will call to schedule your test 2-4 weeks out understanding that some insurance companies will need an authorization prior to the service being performed.   For non-scheduling related questions, please contact the cardiac imaging nurse navigator should you have any questions/concerns: Rockwell Alexandria, Cardiac Imaging Nurse Navigator Larey Brick, Cardiac Imaging Nurse Navigator [facility] Direct Office Dial: [phone]   For scheduling needs, including cancellations and rescheduling, please call [place], [phone].    Follow-Up: At [facility], you and your health needs are our priority.  As part of our continuing mission to provide you with exceptional heart care, we have created designated Provider Care Teams.  These Care Teams include your primary Cardiologist (physician) and Advanced Practice Providers (APPs -  Physician Assistants and Nurse Practitioners) who all work together to provide you with the care you need, when you need it.  We recommend signing up for the patient portal called "MyChart".  Sign up information is provided on this After Visit Summary.  MyChart is used to connect with patients for Virtual Visits (Telemedicine).  Patients are able to view lab/test results, encounter notes, upcoming appointments, etc.  Non-urgent messages can be sent to your provider as well.   To learn more about what you can do with MyChart, go to [email or web address].    Your next appointment:   4 month(s)  Provider:   Thomasene Ripple, DO  Other instructions: 1-800-QUIT-NOW [phone])   If you interested in 1-1 telephonic tobacco cessation coaching with [employer], please call  [phone] to enroll today.   Steps to Quit Smoking Smoking tobacco is the leading cause of preventable death. It can affect almost every organ in the body. Smoking puts you and people around you at risk for many serious, long-lasting (chronic) diseases. Quitting smoking can be hard, but it is one of the best things that you can do for your health. It is never too late to quit. Do not give up if you cannot quit the first time. Some people need to try many times to quit. Do your best to stick to your quit plan, and talk with your doctor if you have any questions or concerns. How do I get ready to quit? Pick a date to quit. Set a date within the next 2 weeks to give you time to prepare. Write down the reasons why you are quitting. Keep this list in places where you will see it often. Tell your family, friends, and co-workers that you are quitting. Their support is important. Talk with your doctor about the choices that may help you quit. Find out if your health insurance will pay for these treatments. Know the people, places, things, and activities that make you want to smoke (triggers). Avoid them. What first steps can I take to quit smoking? Throw away all cigarettes at home, at work, and in your car. Throw away the things that you use when you smoke, such as ashtrays and lighters. Clean your car. Empty the ashtray. Clean your home, including curtains and carpets. What can I do to help me quit smoking? Talk with your doctor about taking medicines and seeing a counselor. You are more likely to succeed when you  do both. If you are pregnant or breastfeeding: Talk with your doctor about counseling or other ways to quit smoking. Do not take medicine to help you quit smoking unless your doctor tells you to. Quit right away Quit smoking completely, instead of slowly cutting back on how much you smoke over a period of time. Stopping smoking right away may be more successful than slowly  quitting. Go to counseling. In-person is best if this is an option. You are more likely to quit if you go to counseling sessions regularly. Take medicine You may take medicines to help you quit. Some medicines need a prescription, and some you can buy over-the-counter. Some medicines may contain a drug called nicotine to replace the nicotine in cigarettes. Medicines may: Help you stop having the desire to smoke (cravings). Help to stop the problems that come when you stop smoking (withdrawal symptoms). Your doctor may ask you to use: Nicotine patches, gum, or lozenges. Nicotine inhalers or sprays. Non-nicotine medicine that you take by mouth. Find resources Find resources and other ways to help you quit smoking and remain smoke-free after you quit. They include: Online chats with a Veterinary surgeon. Phone quitlines. Printed Materials engineer. Support groups or group counseling. Text messaging programs. Mobile phone apps. Use apps on your mobile phone or tablet that can help you stick to your quit plan. Examples of free services include Quit Guide from the CDC and smokefree.gov  What can I do to make it easier to quit?  Talk to your family and friends. Ask them to support and encourage you. Call a phone quitline, such as 1-800-QUIT-NOW, reach out to support groups, or work with a Veterinary surgeon. Ask people who smoke to not smoke around you. Avoid places that make you want to smoke, such as: Bars. Parties. Smoke-break areas at work. Spend time with people who do not smoke. Lower the stress in your life. Stress can make you want to smoke. Try these things to lower stress: Getting regular exercise. Doing deep-breathing exercises. Doing yoga. Meditating. What benefits will I see if I quit smoking? Over time, you may have: A better sense of smell and taste. Less coughing and sore throat. A slower heart rate. Lower blood pressure. Clearer skin. Better breathing. Fewer sick  days. Summary Quitting smoking can be hard, but it is one of the best things that you can do for your health. Do not give up if you cannot quit the first time. Some people need to try many times to quit. When you decide to quit smoking, make a plan to help you succeed. Quit smoking right away, not slowly over a period of time. When you start quitting, get help and support to keep you smoke-free. This information is not intended to replace advice given to you by your health care provider. Make sure you discuss any questions you have with your health care provider. Document Revised: 07/28/2021 Document Reviewed: 07/28/2021 Elsevier Patient Education  2023 Elsevier Inc.    Adopting a Healthy Lifestyle.  Know what a healthy weight is for you (roughly BMI <25) and aim to maintain this   Aim for 7+ servings of fruits and vegetables daily   65-80+ fluid ounces of water or unsweet tea for healthy kidneys   Limit to max 1 drink of alcohol per day; avoid smoking/tobacco   Limit animal fats in diet for cholesterol and heart health - choose grass fed whenever available   Avoid highly processed foods, and foods high in saturated/trans fats  Aim for low stress - take time to unwind and care for your mental health   Aim for 150 min of moderate intensity exercise weekly for heart health, and weights twice weekly for bone health   Aim for 7-9 hours of sleep daily   When it comes to diets, agreement about the perfect plan isnt easy to find, even among the experts. Experts at the [facility] of [employer] developed an idea known as the Healthy Eating Plate. Just imagine a plate divided into logical, healthy portions.   The emphasis is on diet quality:   Load up on vegetables and fruits - one-half of your plate: Aim for color and variety, and remember that potatoes dont count.   Go for whole grains - one-quarter of your plate: Whole wheat, barley, wheat berries, quinoa, oats, brown rice,  and foods made with them. If you want pasta, go with whole wheat pasta.   Protein power - one-quarter of your plate: Fish, chicken, beans, and nuts are all healthy, versatile protein sources. Limit red meat.   The diet, however, does go beyond the plate, offering a few other suggestions.   Use healthy plant oils, such as olive, canola, soy, corn, sunflower and peanut. Check the labels, and avoid partially hydrogenated oil, which have unhealthy trans fats.   If youre thirsty, drink water. Coffee and tea are good in moderation, but skip sugary drinks and limit milk and dairy products to one or two daily servings.   The type of carbohydrate in the diet is more important than the amount. Some sources of carbohydrates, such as vegetables, fruits, whole grains, and beans-are healthier than others.   Finally, stay active  Signed, Thomasene Ripple, DO  12/05/2022 8:52 AM    [facility]

## 2022-12-04 NOTE — Patient Instructions (Addendum)
Medication Instructions:  Your physician recommends that you continue on your current medications as directed. Please refer to the Current Medication list given to you today.  *If you need a refill on your cardiac medications before your next appointment, please call your pharmacy*   Lab Work: Your physician recommends that you have labs drawn today: BMET, Mag, HgbA1c If you have labs (blood work) drawn today and your tests are completely normal, you will receive your results only by: MyChart Message (if you have MyChart) OR A paper copy in the mail If you have any lab test that is abnormal or we need to change your treatment, we will call you to review the results   Testing/Procedures: Your physician has requested that you have an echocardiogram. Echocardiography is a painless test that uses sound waves to create images of your heart. It provides your doctor with information about the size and shape of your heart and how well your heart's chambers and valves are working. This procedure takes approximately one hour. There are no restrictions for this procedure. Please do NOT wear cologne, perfume, aftershave, or lotions (deodorant is allowed). Please arrive 15 minutes prior to your appointment time.  Your physician has recommended that you have a sleep study. This test records several body functions during sleep, including: brain activity, eye movement, oxygen and carbon dioxide blood levels, heart rate and rhythm, breathing rate and rhythm, the flow of air through your mouth and nose, snoring, body muscle movements, and chest and belly movement.    Your cardiac CT will be scheduled at one of the below locations:   [facility] [address] [place], [place] [address] [phone]  If scheduled at [facility], please arrive at the [facility] and Children's Entrance (Entrance C2) of [facility] 30 minutes prior to test start time. You can use the FREE valet  parking offered at entrance C (encouraged to control the heart rate for the test)  Proceed to the [facility] Radiology Department (first floor) to check-in and test prep.  All radiology patients and guests should use entrance C2 at [facility], accessed from [facility], even though the hospital's physical address listed is [address]       Please follow these instructions carefully (unless otherwise directed):   On the Night Before the Test: Be sure to Drink plenty of water. Do not consume any caffeinated/decaffeinated beverages or chocolate 12 hours prior to your test. Do not take any antihistamines 12 hours prior to your test.   On the Day of the Test: Drink plenty of water until 1 hour prior to the test. Do not eat any food 1 hour prior to test. You may take your regular medications prior to the test.  Take metoprolol (Lopressor) two hours prior to test. If you take Furosemide/Hydrochlorothiazide/Spironolactone, please HOLD on the morning of the test. FEMALES- please wear underwire-free bra if available, avoid dresses & tight clothing       After the Test: Drink plenty of water. After receiving IV contrast, you may experience a mild flushed feeling. This is normal. On occasion, you may experience a mild rash up to 24 hours after the test. This is not dangerous. If this occurs, you can take Benadryl 25 mg and increase your fluid intake. If you experience trouble breathing, this can be serious. If it is severe call 911 IMMEDIATELY. If it is mild, please call our office. If you take any of these medications:  Glipizide/Metformin, Avandament, Glucavance, please do not take 48 hours after completing test unless otherwise instructed.  We will call to schedule your test 2-4 weeks out understanding that some insurance companies will need an authorization prior to the service being performed.   For non-scheduling related questions, please contact the  cardiac imaging nurse navigator should you have any questions/concerns: Rockwell Alexandria, Cardiac Imaging Nurse Navigator Larey Brick, Cardiac Imaging Nurse Navigator [facility] Direct Office Dial: [phone]   For scheduling needs, including cancellations and rescheduling, please call [place], [phone].    Follow-Up: At [facility], you and your health needs are our priority.  As part of our continuing mission to provide you with exceptional heart care, we have created designated Provider Care Teams.  These Care Teams include your primary Cardiologist (physician) and Advanced Practice Providers (APPs -  Physician Assistants and Nurse Practitioners) who all work together to provide you with the care you need, when you need it.  We recommend signing up for the patient portal called "MyChart".  Sign up information is provided on this After Visit Summary.  MyChart is used to connect with patients for Virtual Visits (Telemedicine).  Patients are able to view lab/test results, encounter notes, upcoming appointments, etc.  Non-urgent messages can be sent to your provider as well.   To learn more about what you can do with MyChart, go to [email or web address].    Your next appointment:   4 month(s)  Provider:   Thomasene Ripple, DO  Other instructions: 1-800-QUIT-NOW [phone])   If you interested in 1-1 telephonic tobacco cessation coaching with [employer], please call [phone] to enroll today.   Steps to Quit Smoking Smoking tobacco is the leading cause of preventable death. It can affect almost every organ in the body. Smoking puts you and people around you at risk for many serious, long-lasting (chronic) diseases. Quitting smoking can be hard, but it is one of the best things that you can do for your health. It is never too late to quit. Do not give up if you cannot quit the first time. Some people need to try many times to  quit. Do your best to stick to your quit plan, and talk with your doctor if you have any questions or concerns. How do I get ready to quit? Pick a date to quit. Set a date within the next 2 weeks to give you time to prepare. Write down the reasons why you are quitting. Keep this list in places where you will see it often. Tell your family, friends, and co-workers that you are quitting. Their support is important. Talk with your doctor about the choices that may help you quit. Find out if your health insurance will pay for these treatments. Know the people, places, things, and activities that make you want to smoke (triggers). Avoid them. What first steps can I take to quit smoking? Throw away all cigarettes at home, at work, and in your car. Throw away the things that you use when you smoke, such as ashtrays and lighters. Clean your car. Empty the ashtray. Clean your home, including curtains and carpets. What can I do to help me quit smoking? Talk with your doctor about taking medicines and seeing a counselor. You are more likely to succeed when you do both. If you are pregnant or breastfeeding: Talk with your doctor about counseling or other ways to quit smoking. Do not take medicine to help you quit smoking unless your doctor  tells you to. Quit right away Quit smoking completely, instead of slowly cutting back on how much you smoke over a period of time. Stopping smoking right away may be more successful than slowly quitting. Go to counseling. In-person is best if this is an option. You are more likely to quit if you go to counseling sessions regularly. Take medicine You may take medicines to help you quit. Some medicines need a prescription, and some you can buy over-the-counter. Some medicines may contain a drug called nicotine to replace the nicotine in cigarettes. Medicines may: Help you stop having the desire to smoke (cravings). Help to stop the problems that come when you stop  smoking (withdrawal symptoms). Your doctor may ask you to use: Nicotine patches, gum, or lozenges. Nicotine inhalers or sprays. Non-nicotine medicine that you take by mouth. Find resources Find resources and other ways to help you quit smoking and remain smoke-free after you quit. They include: Online chats with a Veterinary surgeon. Phone quitlines. Printed Materials engineer. Support groups or group counseling. Text messaging programs. Mobile phone apps. Use apps on your mobile phone or tablet that can help you stick to your quit plan. Examples of free services include Quit Guide from the CDC and smokefree.gov  What can I do to make it easier to quit?  Talk to your family and friends. Ask them to support and encourage you. Call a phone quitline, such as 1-800-QUIT-NOW, reach out to support groups, or work with a Veterinary surgeon. Ask people who smoke to not smoke around you. Avoid places that make you want to smoke, such as: Bars. Parties. Smoke-break areas at work. Spend time with people who do not smoke. Lower the stress in your life. Stress can make you want to smoke. Try these things to lower stress: Getting regular exercise. Doing deep-breathing exercises. Doing yoga. Meditating. What benefits will I see if I quit smoking? Over time, you may have: A better sense of smell and taste. Less coughing and sore throat. A slower heart rate. Lower blood pressure. Clearer skin. Better breathing. Fewer sick days. Summary Quitting smoking can be hard, but it is one of the best things that you can do for your health. Do not give up if you cannot quit the first time. Some people need to try many times to quit. When you decide to quit smoking, make a plan to help you succeed. Quit smoking right away, not slowly over a period of time. When you start quitting, get help and support to keep you smoke-free. This information is not intended to replace advice given to you by your health care provider.  Make sure you discuss any questions you have with your health care provider. Document Revised: 07/28/2021 Document Reviewed: 07/28/2021 Elsevier Patient Education  2023 [employer].

## 2022-12-05 LAB — HEMOGLOBIN A1C
Est. average glucose Bld gHb Est-mCnc: 108 mg/dL
Hgb A1c MFr Bld: 5.4 % (ref 4.8–5.6)

## 2022-12-05 LAB — BASIC METABOLIC PANEL
BUN/Creatinine Ratio: 10 (ref 9–23)
BUN: 8 mg/dL (ref 6–24)
CO2: 24 mmol/L (ref 20–29)
Calcium: 9.5 mg/dL (ref 8.7–10.2)
Chloride: 102 mmol/L (ref 96–106)
Creatinine, Ser: 0.8 mg/dL (ref 0.57–1.00)
Glucose: 87 mg/dL (ref 70–99)
Potassium: 4.6 mmol/L (ref 3.5–5.2)
Sodium: 140 mmol/L (ref 134–144)
eGFR: 89 mL/min/{1.73_m2} (ref >59)

## 2022-12-05 LAB — MAGNESIUM: Magnesium: 2.3 mg/dL (ref 1.6–2.3)

## 2022-12-10 ENCOUNTER — Telehealth (HOSPITAL_COMMUNITY): Payer: Self-pay | Admitting: *Deleted

## 2022-12-10 NOTE — Telephone Encounter (Signed)
Attempted to call patient regarding upcoming cardiac CT appointment. Voicemail box full and unable to leave a message.  Cerise Lieber RN Navigator Cardiac Imaging [facility] [phone] Office [phone] Cell  

## 2022-12-11 ENCOUNTER — Ambulatory Visit (HOSPITAL_COMMUNITY): Admission: RE | Admit: 2022-12-11 | Payer: BC Managed Care – PPO | Source: Ambulatory Visit

## 2022-12-13 ENCOUNTER — Telehealth: Payer: Self-pay | Admitting: *Deleted

## 2022-12-13 ENCOUNTER — Telehealth (HOSPITAL_COMMUNITY): Payer: Self-pay | Admitting: *Deleted

## 2022-12-13 NOTE — Telephone Encounter (Signed)
Reaching out to patient to offer assistance regarding upcoming cardiac imaging study; pt verbalizes understanding of appt date/time, parking situation and where to check in, pre-test NPO status and medications ordered, and verified current allergies; name and call back number provided for further questions should they arise  Harvey Lingo RN Navigator Cardiac Imaging [facility] [phone] office [phone] cell  Patient to take 100mg metoprolol tartrate two hours prior to her cardiac CT scan.  She is aware to arrive at 3pm. 

## 2022-12-13 NOTE — Telephone Encounter (Signed)
Prior Authorization for Split night sleep study sent to [facility] via web portal. Per web portal no PA is required.

## 2022-12-17 ENCOUNTER — Ambulatory Visit (HOSPITAL_COMMUNITY)
Admission: RE | Admit: 2022-12-17 | Discharge: 2022-12-17 | Disposition: A | Discharge status: Home / Self Care | Payer: BC Managed Care – PPO | Source: Ambulatory Visit | Attending: Cardiology | Admitting: Cardiology

## 2022-12-17 DIAGNOSIS — R0609 Other forms of dyspnea: Secondary | ICD-10-CM

## 2022-12-17 DIAGNOSIS — R072 Precordial pain: Secondary | ICD-10-CM

## 2022-12-17 MED ORDER — NITROGLYCERIN 0.4 MG SL SUBL
SUBLINGUAL_TABLET | SUBLINGUAL | Status: AC
  Filled 2022-12-17: qty 2

## 2022-12-17 MED ORDER — IOHEXOL 350 MG/ML SOLN
95.0000 mL | Freq: Once | INTRAVENOUS | Status: AC | PRN
  Administered 2022-12-17: 95 mL via INTRAVENOUS

## 2022-12-17 MED ORDER — NITROGLYCERIN 0.4 MG SL SUBL
0.8000 mg | SUBLINGUAL_TABLET | Freq: Once | SUBLINGUAL | Status: AC
  Administered 2022-12-17: 0.8 mg via SUBLINGUAL

## 2022-12-28 DIAGNOSIS — K219 Gastro-esophageal reflux disease without esophagitis: Secondary | ICD-10-CM | POA: Diagnosis not present

## 2022-12-28 DIAGNOSIS — I10 Essential (primary) hypertension: Secondary | ICD-10-CM | POA: Diagnosis not present

## 2023-01-03 ENCOUNTER — Ambulatory Visit (HOSPITAL_COMMUNITY): Payer: BC Managed Care – PPO

## 2023-01-03 ENCOUNTER — Encounter (HOSPITAL_BASED_OUTPATIENT_CLINIC_OR_DEPARTMENT_OTHER): Payer: BC Managed Care – PPO | Admitting: Cardiovascular Disease

## 2023-01-11 DIAGNOSIS — I10 Essential (primary) hypertension: Secondary | ICD-10-CM | POA: Diagnosis not present

## 2023-01-24 ENCOUNTER — Ambulatory Visit (HOSPITAL_BASED_OUTPATIENT_CLINIC_OR_DEPARTMENT_OTHER): Payer: BC Managed Care – PPO | Attending: Cardiology | Admitting: Cardiovascular Disease

## 2023-02-04 ENCOUNTER — Ambulatory Visit (HOSPITAL_COMMUNITY): Payer: BC Managed Care – PPO | Attending: Cardiology

## 2023-02-04 DIAGNOSIS — M7989 Other specified soft tissue disorders: Secondary | ICD-10-CM | POA: Insufficient documentation

## 2023-02-04 DIAGNOSIS — R600 Localized edema: Secondary | ICD-10-CM | POA: Diagnosis not present

## 2023-02-04 DIAGNOSIS — R5383 Other fatigue: Secondary | ICD-10-CM | POA: Diagnosis not present

## 2023-02-04 LAB — ECHOCARDIOGRAM COMPLETE
Area-P 1/2: 3.6 cm2
S' Lateral: 2.7 cm

## 2023-04-15 ENCOUNTER — Other Ambulatory Visit (HOSPITAL_COMMUNITY): Payer: Self-pay

## 2023-04-15 ENCOUNTER — Ambulatory Visit: Payer: BC Managed Care – PPO | Attending: Cardiology | Admitting: Cardiology

## 2023-04-15 DIAGNOSIS — E782 Mixed hyperlipidemia: Secondary | ICD-10-CM | POA: Diagnosis not present

## 2023-04-15 DIAGNOSIS — I2510 Atherosclerotic heart disease of native coronary artery without angina pectoris: Secondary | ICD-10-CM | POA: Diagnosis not present

## 2023-04-15 MED ORDER — BLOOD PRESSURE MONITOR MISC
0 refills | Status: AC
  Filled 2023-04-15: qty 1, 1d supply, fill #0

## 2023-04-15 NOTE — Patient Instructions (Addendum)
Medication Instructions:  No changes *If you need a refill on your cardiac medications before your next appointment, please call your pharmacy*  Follow-Up: At [facility], you and your health needs are our priority.  As part of our continuing mission to provide you with exceptional heart care, we have created designated Provider Care Teams.  These Care Teams include your primary Cardiologist (physician) and Advanced Practice Providers (APPs -  Physician Assistants and Nurse Practitioners) who all work together to provide you with the care you need, when you need it.  We recommend signing up for the patient portal called "MyChart".  Sign up information is provided on this After Visit Summary.  MyChart is used to connect with patients for Virtual Visits (Telemedicine).  Patients are able to view lab/test results, encounter notes, upcoming appointments, etc.  Non-urgent messages can be sent to your provider as well.   To learn more about what you can do with MyChart, go to [email or web address].    Your next appointment:   Pharm D- 1-2 weeks- for weight loss  Dr Servando Salina- 6 months  Log BP for 2 weeks, then send in via MyChart  Prescription sent to [facility] outpatient pharmacy for BP cuff

## 2023-04-18 ENCOUNTER — Encounter: Payer: Self-pay | Admitting: Cardiology

## 2023-04-18 ENCOUNTER — Other Ambulatory Visit: Payer: Self-pay

## 2023-04-18 MED ORDER — ROSUVASTATIN CALCIUM 5 MG PO TABS: 5.0000 mg | ORAL_TABLET | Freq: Every day | ORAL | 3 refills | Status: AC

## 2023-04-19 DIAGNOSIS — E782 Mixed hyperlipidemia: Secondary | ICD-10-CM | POA: Insufficient documentation

## 2023-04-19 DIAGNOSIS — I2510 Atherosclerotic heart disease of native coronary artery without angina pectoris: Secondary | ICD-10-CM | POA: Insufficient documentation

## 2023-04-19 NOTE — Progress Notes (Signed)
Cardiology Office Note:    Date:  04/19/2023   ID:  Ashley Bailey, DOB 1971/03/19, MRN [id number]  PCP:  Verlon Au, MD  Cardiologist:  Thomasene Ripple, DO  Electrophysiologist:  None   Referring MD: Verlon Au, MD   " I am ok"  History of Present Illness:    Ashley Bailey is a 52 y.o. female with a hx of minimal CAD, smoker, hypertension, IBS, anxiety, arthritis.  I saw the patient in April 2024 at that time her blood pressure was at target no medication adjustment were made. She did have abnormal EKG so send the patient for a CCTA . She did get her CCTA which showed minimal CAD.  Her echo was normal.   Today she offers no complaints at this time.    Past Medical History:  Diagnosis Date   Anxiety    Arthritis    wrist, knee   Edema    Hypertension    IBS (irritable bowel syndrome)    Seasonal allergies     Past Surgical History:  Procedure Laterality Date   CESAREAN SECTION     X2   COLONOSCOPY N/A 12/06/2015   Procedure: COLONOSCOPY;  Surgeon: West Bali, MD;  Location: [facility];  Service: Endoscopy;  Laterality: N/A;  1145 - moved to 12:00 - office to notify   ESOPHAGOGASTRODUODENOSCOPY N/A 12/06/2015   Procedure: ESOPHAGOGASTRODUODENOSCOPY (EGD);  Surgeon: West Bali, MD;  Location: [facility];  Service: Endoscopy;  Laterality: N/A;   HYSTEROSCOPY WITH RESECTOSCOPE N/A 01/17/2016   Procedure: HYSTEROSCOPY WITH RESECTOSCOPIC POLYPECTOMY WITH MYOSURE;  Surgeon: Ok [place], MD;  Location: [facility] ORS;  Service: Gynecology;  Laterality: N/A;   TONSILLECTOMY     TONSILLECTOMY AND ADENOIDECTOMY     denies adenoids just tonsillectomy   TUBAL LIGATION      Current Medications: Current Meds  Medication Sig   Blood Pressure Monitor MISC Use as directed.     Allergies:   Bee pollen, Blue dyes (parenteral), Pollen extract, and Powder scent fragrance   Social History   Socioeconomic History   Marital status: Single     Spouse name: Not on file   Number of children: Not on file   Years of education: Not on file   Highest education level: Not on file  Occupational History   Not on file  Tobacco Use   Smoking status: Every Day    Current packs/day: 0.50    Average packs/day: 0.5 packs/day for 10.0 years (5.0 ttl pk-yrs)    Types: Cigarettes   Smokeless tobacco: Never  Vaping Use   Vaping status: Never Used  Substance and Sexual Activity   Alcohol use: Yes    Comment: occ   Drug use: No   Sexual activity: Yes    Partners: Male    Birth control/protection: Surgical    Comment: first sexual enconter at 52 yrs old. More than 5 partners in a lifetime  Other Topics Concern   Not on file  Social History Narrative   Not on file   Social Determinants of Health   Financial Resource Strain: Not on file  Food Insecurity: No Food Insecurity (05/15/2021)   [facility] from [facility]   Hunger Vital Sign    Worried About Running Out of Food in the Last Year: Never true    Ran Out of Food in the Last Year: Never true  Transportation Needs: Not on file  Physical Activity: Not on file  Stress: Not on file  Social Connections: Unknown (12/29/2021)   [facility] from [facility], [facility]   Social Network    Social Network: Not on file     Family History: The patient's family history includes Diabetes in her sister; Hypertension in her mother. There is no history of Colon cancer, Allergic rhinitis, Asthma, Angioedema, Atopy, Eczema, Immunodeficiency, or Urticaria.  ROS:   Review of Systems  Constitution: Negative for decreased appetite, fever and weight gain.  HENT: Negative for congestion, ear discharge, hoarse voice and sore throat.   Eyes: Negative for discharge, redness, vision loss in right eye and visual halos.  Cardiovascular: Negative for chest pain, dyspnea on exertion, leg swelling, orthopnea and palpitations.  Respiratory: Negative for cough, hemoptysis, shortness of breath  and snoring.   Endocrine: Negative for heat intolerance and polyphagia.  Hematologic/Lymphatic: Negative for bleeding problem. Does not bruise/bleed easily.  Skin: Negative for flushing, nail changes, rash and suspicious lesions.  Musculoskeletal: Negative for arthritis, joint pain, muscle cramps, myalgias, neck pain and stiffness.  Gastrointestinal: Negative for abdominal pain, bowel incontinence, diarrhea and excessive appetite.  Genitourinary: Negative for decreased libido, genital sores and incomplete emptying.  Neurological: Negative for brief paralysis, focal weakness, headaches and loss of balance.  Psychiatric/Behavioral: Negative for altered mental status, depression and suicidal ideas.  Allergic/Immunologic: Negative for HIV exposure and persistent infections.    EKGs/Labs/Other Studies Reviewed:    The following studies were reviewed today:   EKG:  None today   Reviewed echo and CCTA.  Recent Labs: 12/04/2022: BUN 8; Creatinine, Ser 0.80; Magnesium 2.3; Potassium 4.6; Sodium 140  Recent Lipid Panel    Component Value Date/Time   CHOL 191 05/14/2013 1018    Physical Exam:    VS:  BP (!) 144/84   Pulse 86   Ht 5\' 6"  (1.676 m)   Wt 259 lb (117.5 kg)   LMP 05/26/2016   SpO2 97%   BMI 41.80 kg/m     Wt Readings from Last 3 Encounters:  04/15/23 259 lb (117.5 kg)  12/04/22 257 lb (116.6 kg)  02/17/20 220 lb (99.8 kg)     GEN: Well nourished, well developed in no acute distress HEENT: Normal NECK: No JVD; No carotid bruits LYMPHATICS: No lymphadenopathy CARDIAC: S1S2 noted,RRR, no murmurs, rubs, gallops RESPIRATORY:  Clear to auscultation without rales, wheezing or rhonchi  ABDOMEN: Soft, non-tender, non-distended, +bowel sounds, no guarding. EXTREMITIES: No edema, No cyanosis, no clubbing MUSCULOSKELETAL:  No deformity  SKIN: Warm and dry NEUROLOGIC:  Alert and oriented x 3, non-focal PSYCHIATRIC:  Normal affect, good insight  ASSESSMENT:    1. Morbid  obesity (HCC)   2. Minimal CAD   3. Mixed hyperlipidemia    PLAN:    She is hypertensive in the office today. She will take her blood pressure and send me update via my chart. Will have her follow up for blood pressure check.   Will start the patient on Crestor 5 mg daily for primary prevention in the setting of her minimal CAD.    Echo was normal.  The patient understands the need to lose weight with diet and exercise. We have discussed specific strategies for this.  The patient is in agreement with the above plan. The patient left the office in stable condition.  The patient will follow up in 4 months or sooner if needed.   Medication Adjustments/Labs and Tests Ordered: Current medicines are reviewed at length with the patient today.  Concerns regarding medicines are  outlined above.  Orders Placed This Encounter  Procedures   AMB Referral to [facility] Pharm-D   Meds ordered this encounter  Medications   Blood Pressure Monitor MISC    Sig: Use as directed.    Dispense:  1 each    Refill:  0    ICD: I 10    Patient Instructions  Medication Instructions:  No changes *If you need a refill on your cardiac medications before your next appointment, please call your pharmacy*  Follow-Up: At [facility], you and your health needs are our priority.  As part of our continuing mission to provide you with exceptional heart care, we have created designated Provider Care Teams.  These Care Teams include your primary Cardiologist (physician) and Advanced Practice Providers (APPs -  Physician Assistants and Nurse Practitioners) who all work together to provide you with the care you need, when you need it.  We recommend signing up for the patient portal called "MyChart".  Sign up information is provided on this After Visit Summary.  MyChart is used to connect with patients for Virtual Visits (Telemedicine).  Patients are able to view lab/test results, encounter notes, upcoming  appointments, etc.  Non-urgent messages can be sent to your provider as well.   To learn more about what you can do with MyChart, go to [email or web address].    Your next appointment:   Pharm D- 1-2 weeks- for weight loss  Dr Servando Salina- 6 months  Log BP for 2 weeks, then send in via MyChart  Prescription sent to [facility] outpatient pharmacy for BP cuff   Adopting a Healthy Lifestyle.  Know what a healthy weight is for you (roughly BMI <25) and aim to maintain this   Aim for 7+ servings of fruits and vegetables daily   65-80+ fluid ounces of water or unsweet tea for healthy kidneys   Limit to max 1 drink of alcohol per day; avoid smoking/tobacco   Limit animal fats in diet for cholesterol and heart health - choose grass fed whenever available   Avoid highly processed foods, and foods high in saturated/trans fats   Aim for low stress - take time to unwind and care for your mental health   Aim for 150 min of moderate intensity exercise weekly for heart health, and weights twice weekly for bone health   Aim for 7-9 hours of sleep daily   When it comes to diets, agreement about the perfect plan isnt easy to find, even among the experts. Experts at the [facility] [employer] developed an idea known as the Healthy Eating Plate. Just imagine a plate divided into logical, healthy portions.   The emphasis is on diet quality:   Load up on vegetables and fruits - one-half of your plate: Aim for color and variety, and remember that potatoes dont count.   Go for whole grains - one-quarter of your plate: Whole wheat, barley, wheat berries, quinoa, oats, brown rice, and foods made with them. If you want pasta, go with whole wheat pasta.   Protein power - one-quarter of your plate: Fish, chicken, beans, and nuts are all healthy, versatile protein sources. Limit red meat.   The diet, however, does go beyond the plate, offering a few other suggestions.   Use healthy plant oils, such as  olive, canola, soy, corn, sunflower and peanut. Check the labels, and avoid partially hydrogenated oil, which have unhealthy trans fats.   If youre thirsty, drink water. Coffee and tea are good in  moderation, but skip sugary drinks and limit milk and dairy products to one or two daily servings.   The type of carbohydrate in the diet is more important than the amount. Some sources of carbohydrates, such as vegetables, fruits, whole grains, and beans-are healthier than others.   Finally, stay active  Osvaldo Shipper, DO  04/19/2023 11:33 PM    [facility]

## 2023-04-25 ENCOUNTER — Other Ambulatory Visit (HOSPITAL_COMMUNITY): Payer: Self-pay

## 2023-05-05 ENCOUNTER — Telehealth: Payer: Self-pay | Admitting: Pharmacist

## 2023-05-05 NOTE — Progress Notes (Deleted)
Patient ID: Ashley Bailey                 DOB: [birth year]                    MRN: [id number]     HPI: Ashley Bailey is a 52 y.o. female patient referred to pharmacy clinic by Dr.Tobb to initiate GLP1-RA therapy. PMH is significant for minimal CAD, smoker, hypertension, IBS, anxiety, arthritis, and obesity. Most recent BMI 41.80 kg/m2 and weight 259 lbs.  Baseline weight and BMI: 41.80 kg/m2 weight 259 lbs Current weight and BMI: *** Current meds that affect weight: ****  *** If diabetic and on insulin/sulfonylurea, can consider reducing dose to reduce risk of hypoglycemia  *** Follow-up visit  Assess % weight loss Assess adverse effects Missed doses  Diet:   Exercise:   Family History:   Social History:   Labs: Lab Results  Component Value Date   HGBA1C 5.4 12/04/2022    Wt Readings from Last 1 Encounters:  04/15/23 259 lb (117.5 kg)    BP Readings from Last 1 Encounters:  04/15/23 (!) 144/84   Pulse Readings from Last 1 Encounters:  04/15/23 86       Component Value Date/Time   CHOL 191 05/14/2013 1018    Past Medical History:  Diagnosis Date   Anxiety    Arthritis    wrist, knee   Edema    Hypertension    IBS (irritable bowel syndrome)    Seasonal allergies     Current Outpatient Medications on File Prior to Visit  Medication Sig Dispense Refill   Ascorbic Acid (VITAMIN C) 1000 MG tablet Take 1,000 mg by mouth daily.     b complex vitamins tablet Take 1 tablet by mouth daily.     Blood Pressure Monitor MISC Use as directed. 1 each 0   cholecalciferol (VITAMIN D3) 25 MCG (1000 UNIT) tablet Take 1,000 Units by mouth daily.     clonazePAM (KLONOPIN) 1 MG tablet Take 1 mg by mouth 2 (two) times daily as needed for anxiety.     fexofenadine (ALLEGRA) 180 MG tablet TAKE 1 TABLET BY MOUTH EVERY DAY 30 tablet 1   furosemide (LASIX) 20 MG tablet Take by mouth.     metoprolol tartrate (LOPRESSOR) 100 MG tablet Take 2 hours prior to CT 1 tablet  0   Multiple Vitamin (MULTIVITAMIN WITH MINERALS) TABS tablet Take 1 tablet by mouth daily.     pantoprazole (PROTONIX) 40 MG tablet Take 1 tablet (40 mg total) by mouth daily. 90 tablet 3   potassium chloride (KLOR-CON) 10 MEQ tablet Take 10 mEq by mouth daily.     rosuvastatin (CRESTOR) 5 MG tablet Take 1 tablet (5 mg total) by mouth daily. 90 tablet 3   Vitamin A 2400 MCG (8000 UT) CAPS Take by mouth daily at 6 (six) AM.     No current facility-administered medications on file prior to visit.    Allergies  Allergen Reactions   Bee Pollen     Other Reaction(s): Other   Blue Dyes (Parenteral)    Pollen Extract    Powder Scent Fragrance      Assessment/Plan:  1. Weight loss - Patient has not met goal of at least 5% of body weight loss with comprehensive lifestyle modifications alone in the past 3-6 months. Pharmacotherapy is appropriate to pursue as augmentation. Will start ***. Confirmed patient not ***pregnant and no personal or family history of medullary  thyroid carcinoma (MTC) or Multiple Endocrine Neoplasia syndrome type 2 (MEN 2). Injection technique reviewed at today's visit.  Advised patient on common side effects including nausea, diarrhea, dyspepsia, decreased appetite, and fatigue. Counseled patient on reducing meal size and how to titrate medication to minimize side effects. Counseled patient to call if intolerable side effects or if experiencing dehydration, abdominal pain, or dizziness. Patient will adhere to dietary modifications and will target at least 150 minutes of moderate intensity exercise weekly.   Follow up in 1 month via telephone for tolerability update and dose titration.

## 2023-05-06 ENCOUNTER — Other Ambulatory Visit (HOSPITAL_COMMUNITY): Payer: Self-pay

## 2023-05-06 ENCOUNTER — Ambulatory Visit: Payer: BC Managed Care – PPO | Attending: Cardiology

## 2023-05-06 ENCOUNTER — Telehealth: Payer: Self-pay | Admitting: Pharmacy Technician

## 2023-05-06 NOTE — Telephone Encounter (Signed)
PA request has been Submitted. New Encounter created for follow up. For additional info see Pharmacy Prior Auth telephone encounter from 05/06/23.

## 2023-05-06 NOTE — Telephone Encounter (Signed)
Pharmacy Patient Advocate Encounter   Received notification from Pt Calls Messages that prior authorization for wegovy is required/requested.   Insurance verification completed.   The patient is insured through  [employer]  .   Per test claim: PA required; PA submitted to [employer] via CoverMyMeds Key/confirmation #/[id number] Status is pending

## 2023-05-06 NOTE — Telephone Encounter (Signed)
Pharmacy Patient Advocate Encounter  Received notification from  [employer]  that Prior Authorization for wegovy has been APPROVED from 05/06/23 to 12/02/23. Ran test claim, Copay is $0.00. This test claim was processed through [facility]- copay amounts may vary at other pharmacies due to pharmacy/plan contracts, or as the patient moves through the different stages of their insurance plan.   PA #/Case ID/Reference #: [id number]

## 2023-05-09 DIAGNOSIS — K219 Gastro-esophageal reflux disease without esophagitis: Secondary | ICD-10-CM | POA: Diagnosis not present

## 2023-05-09 DIAGNOSIS — F419 Anxiety disorder, unspecified: Secondary | ICD-10-CM | POA: Diagnosis not present

## 2023-05-09 DIAGNOSIS — K582 Mixed irritable bowel syndrome: Secondary | ICD-10-CM | POA: Diagnosis not present

## 2023-05-09 DIAGNOSIS — I10 Essential (primary) hypertension: Secondary | ICD-10-CM | POA: Diagnosis not present

## 2023-05-09 MED ORDER — WEGOVY 0.25 MG/0.5ML ~~LOC~~ SOAJ: 0.2500 mg | SUBCUTANEOUS | 0 refills | Status: DC

## 2023-05-09 NOTE — Telephone Encounter (Signed)
Patient has rescheduled appointment on Sept 24.

## 2023-05-09 NOTE — Addendum Note (Signed)
Addended by: Tylene Fantasia on: 05/09/2023 12:35 PM   Modules accepted: Orders

## 2023-05-14 ENCOUNTER — Ambulatory Visit: Payer: BC Managed Care – PPO

## 2023-06-28 ENCOUNTER — Telehealth: Payer: Self-pay | Admitting: Cardiology

## 2023-06-28 NOTE — Telephone Encounter (Signed)
Pt c/o medication issue:  1. Name of Medication:   Semaglutide-Weight Management (WEGOVY) 0.25 MG/0.5ML SOAJ   2. How are you currently taking this medication (dosage and times per day)?  Not started yet  3. Are you having a reaction (difficulty breathing--STAT)?   4. What is your medication issue?   Patient stated she received this medication and needed to cancel her visit on 11/11 due to a work conflict.  Patient wants to know if she should reschedule her in-office visit or if she can receive instructions over the telephone.

## 2023-07-01 ENCOUNTER — Ambulatory Visit: Payer: BC Managed Care – PPO

## 2023-07-01 NOTE — Telephone Encounter (Signed)
Appointment for Mon, Dec 9 at 1:30 scheduled. Patient to bring [facility] with her.

## 2023-07-03 ENCOUNTER — Telehealth: Payer: Self-pay | Admitting: Cardiology

## 2023-07-03 NOTE — Telephone Encounter (Signed)
Attempted to leave vm to schedule appt for PharmD referral (Hypertension) from Dr. Servando Salina - vm box full.  JB, 07-03-23

## 2023-07-13 IMAGING — MG MM DIGITAL SCREENING BILAT W/ TOMO AND CAD
8 series · 8 of 24 positions shown · non-contrast
Comparison: Previous exam(s).

CLINICAL DATA: Screening.

EXAM:
DIGITAL SCREENING BILATERAL MAMMOGRAM WITH TOMOSYNTHESIS AND CAD
TECHNIQUE: Bilateral screening digital craniocaudal and mediolateral oblique
mammograms were obtained. Bilateral screening digital breast
tomosynthesis was performed. The images were evaluated with
computer-aided detection.

[L CC synth-2D]
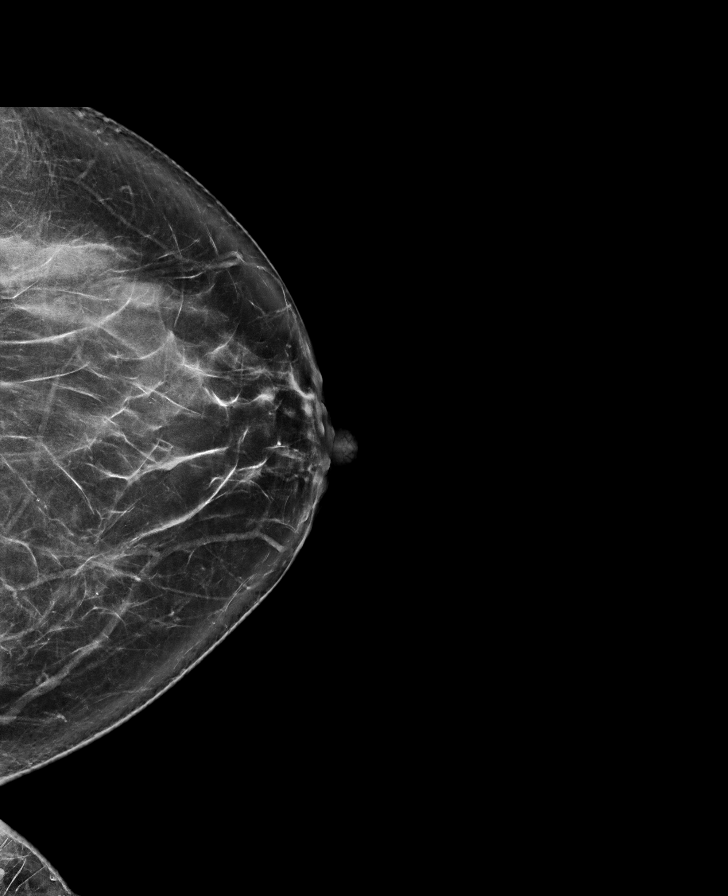

[R MLO synth-2D]
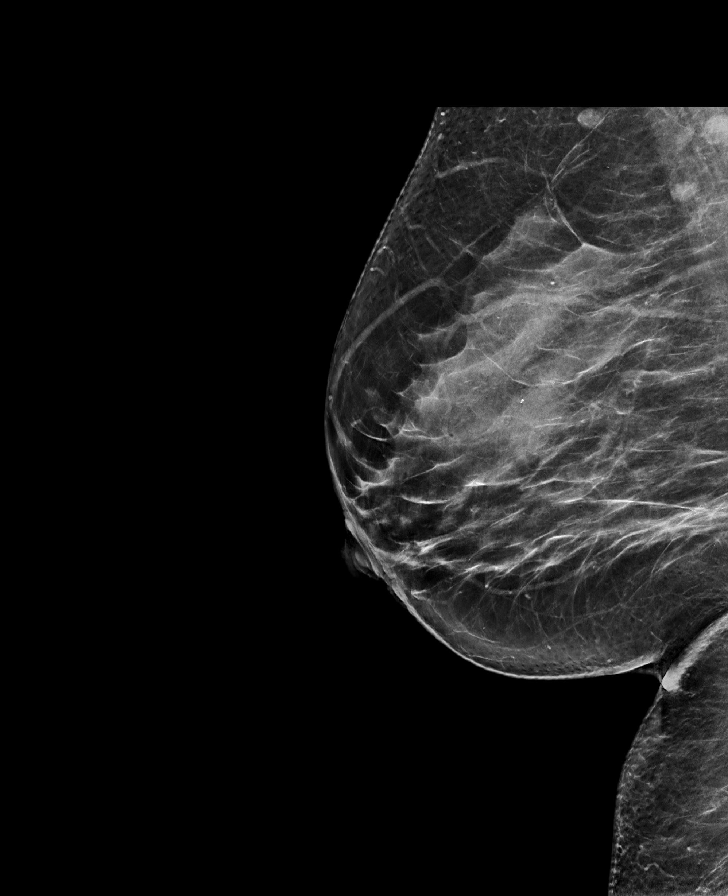

[R CC synth-2D]
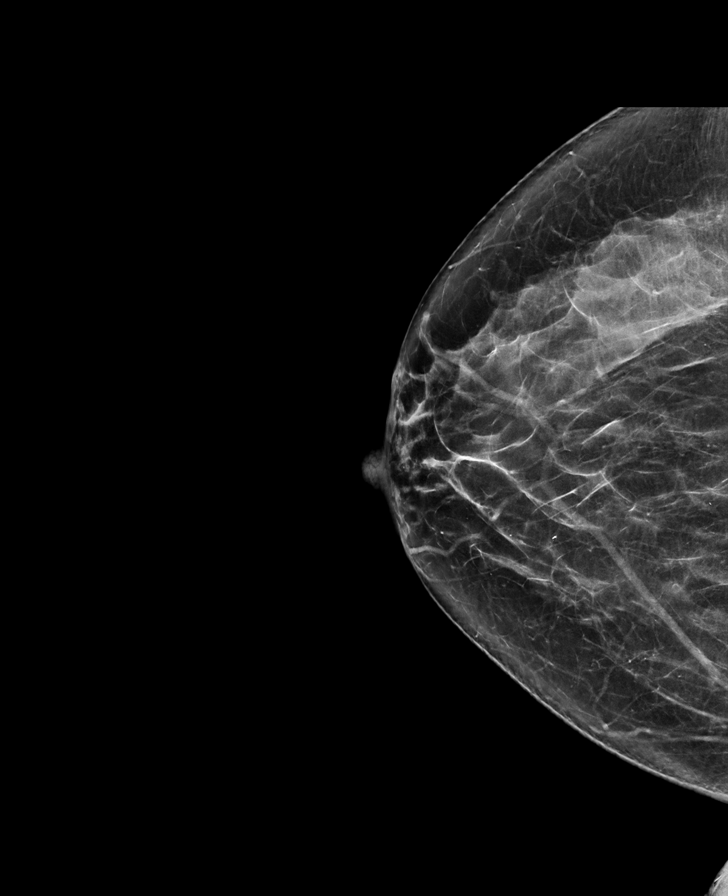

[L MLO synth-2D]
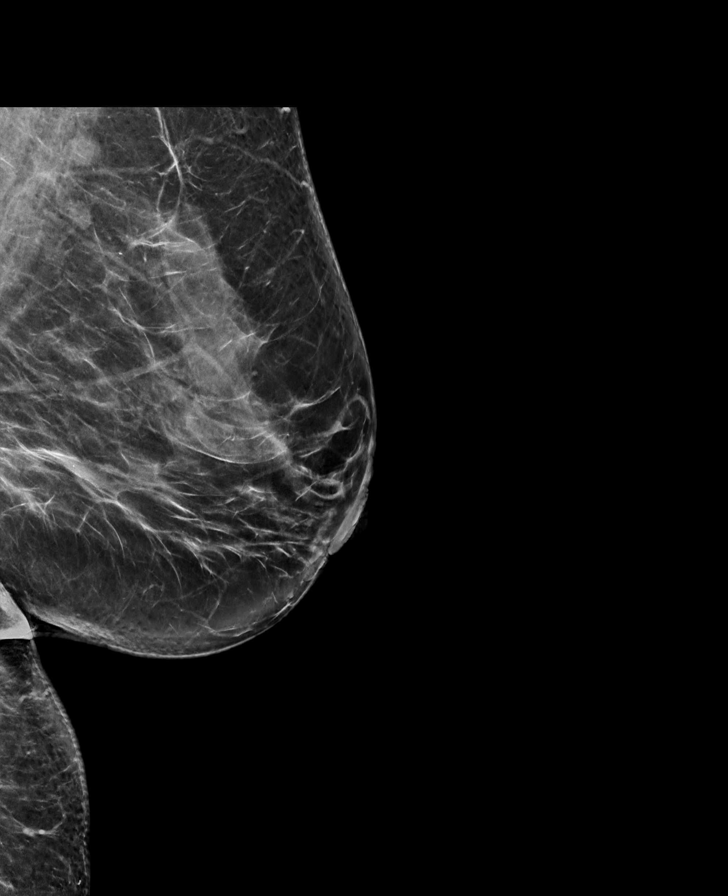

[L MLO tomo · tomo slice 45/89.0]
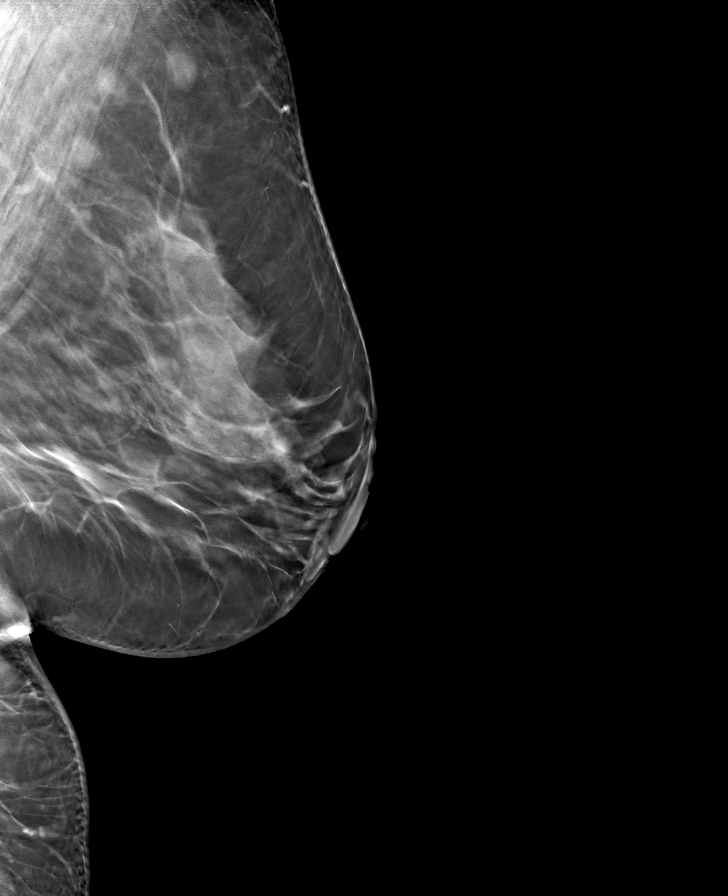

[R MLO tomo · tomo slice 43/86.0]
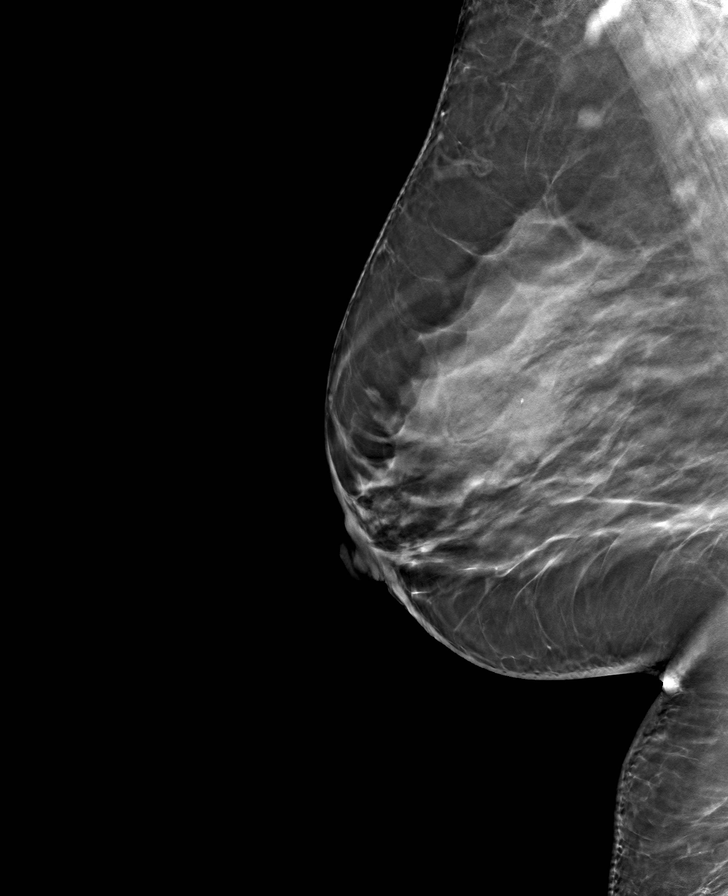

[R CC tomo · tomo slice 39/76.0]
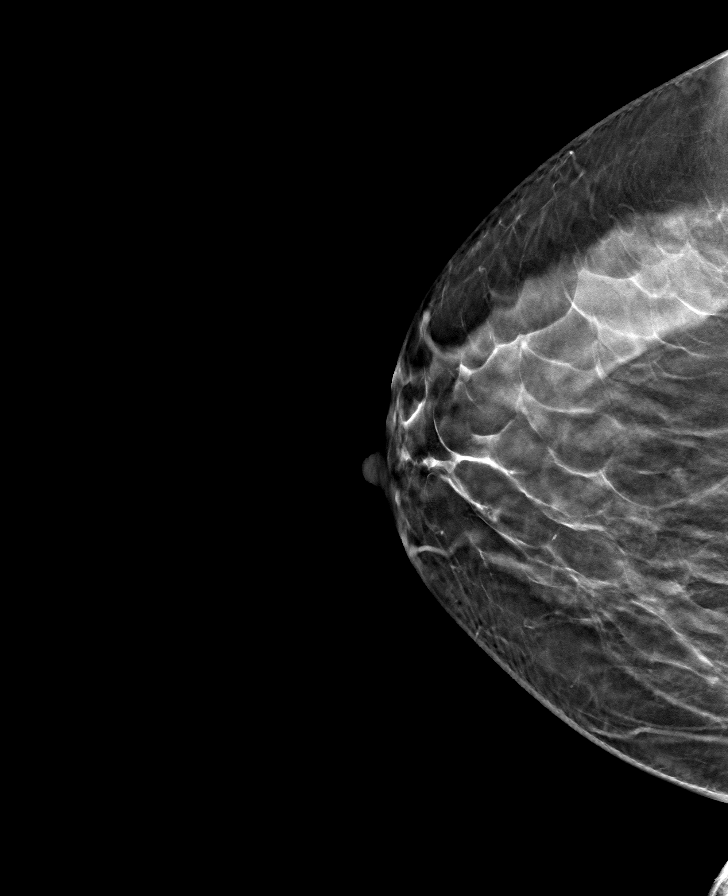

[L CC tomo · tomo slice 42/83.0]
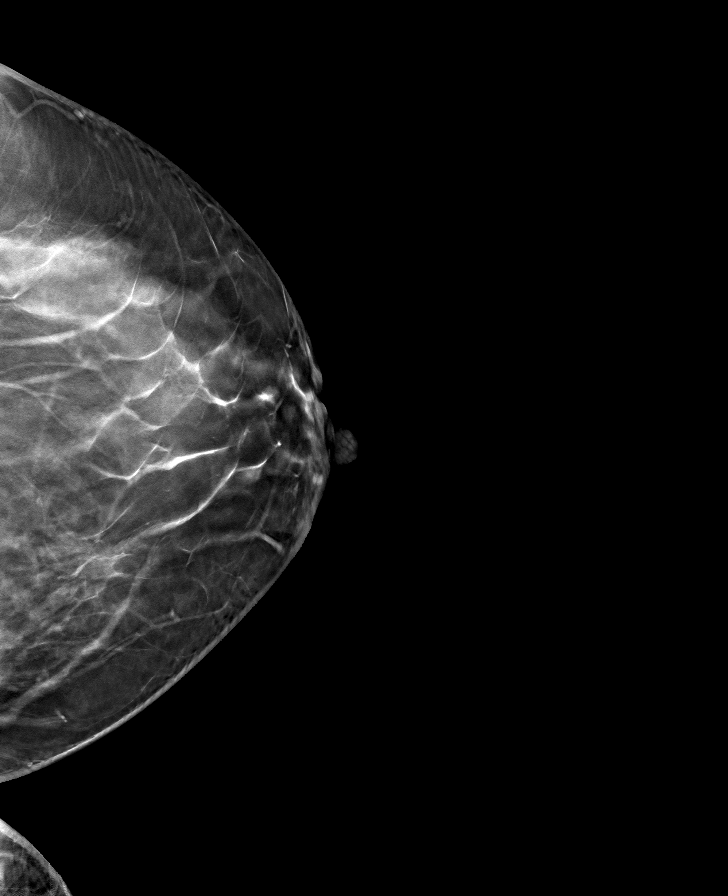

[8 of 24 positions shown; findings below may reference images not displayed]

ACR Breast Density Category c: The breast tissue is heterogeneously
dense, which may obscure small masses.
FINDINGS: There are no findings suspicious for malignancy.
IMPRESSION: No mammographic evidence of malignancy. A result letter of this
screening mammogram will be mailed directly to the patient.

RECOMMENDATION:
Screening mammogram in one year. (Code:[id number])

BI-RADS CATEGORY  1: Negative.

## 2023-07-25 ENCOUNTER — Ambulatory Visit: Payer: BC Managed Care – PPO | Attending: Cardiology | Admitting: Cardiology

## 2023-07-29 ENCOUNTER — Ambulatory Visit: Payer: BC Managed Care – PPO | Attending: Cardiovascular Disease | Admitting: Student

## 2023-07-29 ENCOUNTER — Encounter: Payer: Self-pay | Admitting: Student

## 2023-07-29 VITALS — BP 135/80 | HR 79 | Ht 66.0 in | Wt 260.0 lb

## 2023-07-29 DIAGNOSIS — I10 Essential (primary) hypertension: Secondary | ICD-10-CM | POA: Insufficient documentation

## 2023-07-29 NOTE — Assessment & Plan Note (Signed)
Assessment: In office BP 135/80 mmHg heart rate 79  (goal<130/80) BP at PCP ~120/75 at 2 different visit per patient  Takes and tolerates losartan well without any side effects Denies SOB, palpitation, chest pain, headaches,or swelling Eats processed food that is high in sodium and does not exercise  Reiterated the importance of regular exercise and low salt diet  Patient just started [facility] for weight loss  Plan:  Continue taking losartan 25 mg by mouth daily  Patient is in agreement to follow low salt healthy diet and will start doing regular exercise such as walking 15-30 min everyday   Patient to keep record of BP readings with heart rate and report to [place] at the next visit Patient to see PharmD in 4 weeks for follow up In future if BP remains above goal should consider adding amlodipine or thiazide diuretics   Follow up lab(s):none

## 2023-07-29 NOTE — Progress Notes (Unsigned)
Patient ID: Ashley Bailey                 DOB: May 28, 1971                    MRN: [id number]  .  HPI: Ashley Bailey is a 52 y.o. female patient referred to pharmacy clinic by Dr.Tobb to initiate GLP1-RA therapy. PMH is significant for CAD, GERD, Smoking,HLD,HTN and obesity. Most recent BMI 41.82. Patient presented today for GLP 1 consultation and BP follow up. States she has gained 50 lbs around Covid time. Her goal weight is 170 lbs first. Her goal is to get off of Wegovy after 1-1.5 yrs and achieve remaining goal weight of 150 lbs off of the medication through healthy lifestyle. Her BP was elevate at Dr. Mallory Shirk appointment. However during last couple visits at PCP her BP is ~120/75-78 range. She is taking  low dose of losartan and she tolerates it well. She does not eats large meals but she eats wrong unhealthy processed food. She does not like fruits and vegetables.  Baseline weight and BMI: 259lbs 41.82 Current weight and BMI: 259 lbs 41.82 Goal weight 170 lbs  Current meds that affect weight: none   Current HTN meds: losartan 25 mg daily  Previously tried: none  BP goal: <130/80   Family History:  Relation Problem Comments  Mother Hypertension     Sister Diabetes     Social History:  Alcohol: 2-3 drinks per week  Smoking: 1/2 pack per day    Diet: fried chicken breast, pizza  Snacks - 2-3 regular soda in a day  Soda- chotoes, chocolate bar   Exercise: none    Home BP readings: does not check BP at home     Labs: Lab Results  Component Value Date   HGBA1C 5.4 12/04/2022    Wt Readings from Last 1 Encounters:  07/29/23 260 lb (117.9 kg)    BP Readings from Last 1 Encounters:  07/29/23 135/80   Pulse Readings from Last 1 Encounters:  07/29/23 79       Component Value Date/Time   CHOL 191 05/14/2013 1018    Past Medical History:  Diagnosis Date   Anxiety    Arthritis    wrist, knee   Edema    Hypertension    IBS (irritable bowel  syndrome)    Seasonal allergies     Current Outpatient Medications on File Prior to Visit  Medication Sig Dispense Refill   Ascorbic Acid (VITAMIN C) 1000 MG tablet Take 1,000 mg by mouth daily.     b complex vitamins tablet Take 1 tablet by mouth daily.     Blood Pressure Monitor MISC Use as directed. 1 each 0   cholecalciferol (VITAMIN D3) 25 MCG (1000 UNIT) tablet Take 1,000 Units by mouth daily.     clonazePAM (KLONOPIN) 1 MG tablet Take 1 mg by mouth 2 (two) times daily as needed for anxiety.     fexofenadine (ALLEGRA) 180 MG tablet TAKE 1 TABLET BY MOUTH EVERY DAY 30 tablet 1   furosemide (LASIX) 20 MG tablet Take by mouth.     losartan (COZAAR) 25 MG tablet Take 25 mg by mouth daily.     metoprolol tartrate (LOPRESSOR) 100 MG tablet Take 2 hours prior to CT 1 tablet 0   Multiple Vitamin (MULTIVITAMIN WITH MINERALS) TABS tablet Take 1 tablet by mouth daily.     pantoprazole (PROTONIX) 40 MG tablet Take 1 tablet (40  mg total) by mouth daily. 90 tablet 3   potassium chloride (KLOR-CON) 10 MEQ tablet Take 10 mEq by mouth daily.     rosuvastatin (CRESTOR) 5 MG tablet Take 1 tablet (5 mg total) by mouth daily. 90 tablet 3   Semaglutide-Weight Management (WEGOVY) 0.25 MG/0.5ML SOAJ Inject 0.25 mg into the skin once a week. 2 mL 0   Vitamin A 2400 MCG (8000 UT) CAPS Take by mouth daily at 6 (six) AM.     No current facility-administered medications on file prior to visit.    Allergies  Allergen Reactions   Bee Pollen     Other Reaction(s): Other   Blue Dyes (Parenteral)    Pollen Extract    Powder Scent Fragrance      Assessment/Plan:  1. Weight loss - Patient has not met goal of at least 5% of body weight loss with comprehensive lifestyle modifications alone in the past 3-6 months. Pharmacotherapy is appropriate to pursue as augmentation. Will start Wegovy. Patient received her 1st dose of 0.25 mg in the office today Confirmed patient not pregnant and no personal or family  history of medullary thyroid carcinoma (MTC) or Multiple Endocrine Neoplasia syndrome type 2 (MEN 2). Denies any gall balder issues and hx of pancreatitis Injection technique reviewed at today's visit. Advised patient on common side effects including nausea, diarrhea, dyspepsia, decreased appetite, and fatigue. Counseled patient on reducing meal size and how to titrate medication to minimize side effects. Counseled patient to call if intolerable side effects or if experiencing dehydration, abdominal pain, or dizziness. Patient will adhere to dietary modifications and will target at least 150 minutes of moderate intensity exercise weekly.   Follow up in 1 month via telephone for tolerability update and dose titration. 2. Hypertension  Assessment: In office BP 135/80 mmHg heart rate 79  (goal<130/80) BP at PCP ~120/75 at 2 different visit per patient  Takes and tolerates losartan well without any side effects Denies SOB, palpitation, chest pain, headaches,or swelling Eats processed food that is high in sodium and does not exercise  Reiterated the importance of regular exercise and low salt diet  Patient just started [facility] for weight loss  Plan:  Continue taking losartan 25 mg by mouth daily  Patient is in agreement to follow low salt healthy diet and will start doing regular exercise such as walking 15-30 min everyday   Patient to keep record of BP readings with heart rate and report to [place] at the next visit Patient to see PharmD in 4 weeks for follow up In future if BP remains above goal should consider adding amlodipine or thiazide diuretics   Follow up lab(s):none

## 2023-07-29 NOTE — Patient Instructions (Addendum)
Changes made by your pharmacist Carmela Hurt, PharmD at today's visit:    Instructions/Changes  (what do you need to do) Your Notes  (what you did and when you did it)  Continue taking losartan 25 mg daily and Wegovy 0.25 mg once a week    Follow low salt diet    Exercise regularly     Bring all of your meds, your BP cuff and your record of home blood pressures to your next appointment.    HOW TO TAKE YOUR BLOOD PRESSURE AT HOME  Rest 5 minutes before taking your blood pressure.  Don't smoke or drink caffeinated beverages for at least 30 minutes before. Take your blood pressure before (not after) you eat. Sit comfortably with your back supported and both feet on the floor (don't cross your legs). Elevate your arm to heart level on a table or a desk. Use the proper sized cuff. It should fit smoothly and snugly around your bare upper arm. There should be enough room to slip a fingertip under the cuff. The bottom edge of the cuff should be 1 inch above the crease of the elbow. Ideally, take 3 measurements at one sitting and record the average.  Important lifestyle changes to control high blood pressure  Intervention  Effect on the BP  Lose extra pounds and watch your waistline Weight loss is one of the most effective lifestyle changes for controlling blood pressure. If you're overweight or obese, losing even a small amount of weight can help reduce blood pressure. Blood pressure might go down by about 1 millimeter of mercury (mm Hg) with each kilogram (about 2.2 pounds) of weight lost.  Exercise regularly As a general goal, aim for at least 30 minutes of moderate physical activity every day. Regular physical activity can lower high blood pressure by about 5 to 8 mm Hg.  Eat a healthy diet Eating a diet rich in whole grains, fruits, vegetables, and low-fat dairy products and low in saturated fat and cholesterol. A healthy diet can lower high blood pressure by up to 11 mm Hg.  Reduce  salt (sodium) in your diet Even a small reduction of sodium in the diet can improve heart health and reduce high blood pressure by about 5 to 6 mm Hg.  Limit alcohol One drink equals 12 ounces of beer, 5 ounces of wine, or 1.5 ounces of 80-proof liquor.  Limiting alcohol to less than one drink a day for women or two drinks a day for men can help lower blood pressure by about 4 mm Hg.   If you have any questions or concerns please use My Chart to send questions or call the office at [phone]

## 2023-08-03 ENCOUNTER — Other Ambulatory Visit: Payer: Self-pay | Admitting: Cardiology

## 2023-08-08 NOTE — Telephone Encounter (Signed)
Spoke to pt, tolerates 0.25 mg dose well, lost 5 lbs so far still has 2 more doses of 0.25 mg will be starting 0.5 mg Wegovy dose from Jan 6,2025. Next follow up in 6 weeks (Jan 29,2025)

## 2023-08-20 ENCOUNTER — Telehealth: Payer: Self-pay | Admitting: Cardiology

## 2023-08-20 NOTE — Telephone Encounter (Signed)
 Pt c/o medication issue:  1. Name of Medication:   Semaglutide -Weight Management (WEGOVY ) 0.5 MG/0.5ML SOAJ   2. How are you currently taking this medication (dosage and times per day)?   As prescribed  3. Are you having a reaction (difficulty breathing--STAT)?   4. What is your medication issue?   Patient stated she has not started on her new prescription as she was supposed to go to the next dosage level and her prescription is still showing the 0.5 dosage.  Patient wants a call back to confirm if new dosage should be called in to her pharmacy ([facility] [phone] - [place], [place] - [address]).

## 2023-08-22 MED ORDER — WEGOVY 1 MG/0.5ML ~~LOC~~ SOAJ: 1.0000 mg | SUBCUTANEOUS | 0 refills | Status: DC

## 2023-09-03 ENCOUNTER — Ambulatory Visit: Payer: BC Managed Care – PPO | Attending: Cardiology | Admitting: Pharmacist

## 2023-09-03 VITALS — BP 120/80 | HR 75 | Wt 250.0 lb

## 2023-09-03 DIAGNOSIS — I10 Essential (primary) hypertension: Secondary | ICD-10-CM | POA: Diagnosis not present

## 2023-09-03 DIAGNOSIS — E66813 Obesity, class 3: Secondary | ICD-10-CM | POA: Diagnosis not present

## 2023-09-03 DIAGNOSIS — Z6841 Body Mass Index (BMI) 40.0 and over, adult: Secondary | ICD-10-CM | POA: Diagnosis not present

## 2023-09-03 DIAGNOSIS — E6609 Other obesity due to excess calories: Secondary | ICD-10-CM | POA: Insufficient documentation

## 2023-09-03 NOTE — Progress Notes (Signed)
 Patient ID: Ashley Bailey                 DOB: 01/10/1971                    MRN: [id number]  .  HPI: Ashley Bailey is a 53 y.o. female patient referred to pharmacy clinic by Dr.Tobb to initiate GLP1-RA therapy. PMH is significant for CAD, GERD, Smoking,HLD,HTN and obesity. Most recent BMI 41.82.  Patient seen 07/29/23 to initiate Wegovy . BP at that visit was 135/80. She reported BP at her PCP office 120/75-78. She was asked to monitor BP at home and follow up in 4 weeks. She was encouraged to start exercising.  Patient presents today for follow-up.  She just got a new blood pressure cuff so she has not started to monitor at home yet.  She was concerned because she was at the dentist the other day and her blood pressure was 160/90.  However the use of wrist cuff and technique was poor.  She denies any dizziness lightheadedness, headaches, blurry vision, swelling.  She is on 0.5 mg of Wegovy .  Tolerating pretty well does have some nausea and constipation.  Recommended that she eat a little something to keep some food on her stomach.  She states that she has not changed the types of food she eats.  Still eating fried foods but less of it.  She says that she is no longer snacking.  Still drinking soda but less.  She has started exercising.  She will walk up the steps for 5 times in the repeat throughout the day.  She will also do YouTube exercise videos or dance in her living room.  The 1 mg was called in for her, but she states that she will let us  know if she would like to stay at 0.5.  She has given 2 injections of 0.5.  Not ready to quit smoking yet.  States her employer will provide resources for free when she is ready to quit.   Baseline weight and BMI: 259lbs 41.82 Current weight and BMI: 250 lbs 40.35 Goal weight 170 lbs  Current meds that affect weight: none   Current HTN meds: losartan 25 mg daily  Previously tried: none  BP goal: <130/80   Family History:  Relation  Problem Comments  Mother Hypertension     Sister Diabetes     Social History:  Alcohol: 2-3 drinks per week  Smoking: 1/2 pack per day    Diet: fried chicken breast, pizza  Snacks - 2-3 regular soda in a day  Soda- chotoes, chocolate bar   Exercise: walks up and down the stairs, aerobics, dance videos 4-5 times a week for 10-15 min (sometimes repeats)   Home BP readings: does not check BP at home     Labs: Lab Results  Component Value Date   HGBA1C 5.4 12/04/2022    Wt Readings from Last 1 Encounters:  07/29/23 260 lb (117.9 kg)    BP Readings from Last 1 Encounters:  07/29/23 135/80   Pulse Readings from Last 1 Encounters:  07/29/23 79       Component Value Date/Time   CHOL 191 05/14/2013 1018    Past Medical History:  Diagnosis Date   Anxiety    Arthritis    wrist, knee   Edema    Hypertension    IBS (irritable bowel syndrome)    Seasonal allergies     Current Outpatient Medications on File Prior  to Visit  Medication Sig Dispense Refill   Ascorbic Acid (VITAMIN C) 1000 MG tablet Take 1,000 mg by mouth daily.     b complex vitamins tablet Take 1 tablet by mouth daily.     Blood Pressure Monitor MISC Use as directed. 1 each 0   cholecalciferol (VITAMIN D3) 25 MCG (1000 UNIT) tablet Take 1,000 Units by mouth daily.     clonazePAM (KLONOPIN) 1 MG tablet Take 1 mg by mouth 2 (two) times daily as needed for anxiety.     fexofenadine  (ALLEGRA ) 180 MG tablet TAKE 1 TABLET BY MOUTH EVERY DAY 30 tablet 1   furosemide (LASIX) 20 MG tablet Take by mouth.     losartan (COZAAR) 25 MG tablet Take 25 mg by mouth daily.     metoprolol  tartrate (LOPRESSOR ) 100 MG tablet Take 2 hours prior to CT 1 tablet 0   Multiple Vitamin (MULTIVITAMIN WITH MINERALS) TABS tablet Take 1 tablet by mouth daily.     pantoprazole  (PROTONIX ) 40 MG tablet Take 1 tablet (40 mg total) by mouth daily. 90 tablet 3   potassium chloride (KLOR-CON) 10 MEQ tablet Take 10 mEq by mouth daily.      rosuvastatin  (CRESTOR ) 5 MG tablet Take 1 tablet (5 mg total) by mouth daily. 90 tablet 3   Semaglutide -Weight Management (WEGOVY ) 1 MG/0.5ML SOAJ Inject 1 mg into the skin once a week. 2 mL 0   Vitamin A 2400 MCG (8000 UT) CAPS Take by mouth daily at 6 (six) AM.     No current facility-administered medications on file prior to visit.    Allergies  Allergen Reactions   Bee Pollen     Other Reaction(s): Other   Blue Dyes (Parenteral)    Pollen Extract    Powder Scent Fragrance      Assessment/Plan:  1. Weight loss -Patient has lost about 10 pounds since starting Wegovy .  Continue Wegovy  0.5 mg weekly.  Continue working on exercise and diet.  Encouraged her to cut out sugary drinks.  Follow up in 1 month via telephone for tolerability update and dose titration. 2. Hypertension  Assessment: In office BP 120/80 mmHg heart rate 75  (goal<130/80) She just got a new home upper arm cuff Reviewed proper technique for checking blood pressure On losartan 25 mg daily Plan:  Continue taking losartan 25 mg by mouth daily  Patient is in agreement to follow low salt healthy diet and will start doing regular exercise such as walking 15-30 min everyday   Patient to keep record of BP readings with heart rate and report to us  at the next visit Follow-up in 5 weeks If blood pressure still elevated at that time could consider increasing losartan Follow up lab(s):none  Thank you,  Lorriane Dehart D Muneer Leider, Pharm.D, BCACP, CPP [facility] [address] [place], [place] [address]  Phone: [phone]; Fax: [phone]

## 2023-09-03 NOTE — Patient Instructions (Addendum)
 Your blood pressure goal is < 130/63mmHg   Please continue losartan 25mg  daily Start monitoring at home  Continue with exercise and increase as able  Please call us  at [phone] or [phone] with any questions   Important lifestyle changes to control high blood pressure  Intervention  Effect on the BP   Weight loss Weight loss is one of the most effective lifestyle changes for controlling blood pressure. If you're overweight or obese, losing even a small amount of weight can help reduce blood pressure.    Blood pressure can decrease by 1 millimeter of mercury (mmHg) with each kilogram (about 2.2 pounds) of weight lost.   Exercise regularly As a general goal, aim for 30 minutes of moderate physical activity every day.    Regular physical activity can lower blood pressure by 5 - 8 mmHg.   Eat a healthy diet Eat a diet rich in whole grains, fruits, vegetables, lean meat, and low-fat dairy products. Limit processed foods, saturated fat, and sweets.    A heart-healthy diet can lower high blood pressure by 10 mmHg.   Reduce salt (sodium) in your diet Aim for 000mg  of sodium each day. Avoid deli meats, canned food, and frozen microwave meals which are high in sodium.     Limiting sodium can reduce blood pressure by 5 mmHg.   Limit alcohol One drink equals 12 ounces of beer, 5 ounces of wine, or 1.5 ounces of 80-proof liquor.    Limiting alcohol to < 1 drink a day for women or < 2 drinks a day for men can help lower blood pressure by about 4 mmHg.   To check your pressure at home you will need to:   Sit up in a chair, with feet flat on the floor and back supported. Do not cross your ankles or legs. Rest your left arm so that the cuff is about heart level. If the cuff goes on your upper arm, then just relax your arm on the table, arm of the chair, or your lap. If you have a wrist cuff, hold your wrist against your chest at heart level. Place the cuff snugly around your  arm, about 1 inch above the crease of your elbow. The cords should be inside the groove of your elbow.  Sit quietly, with the cuff in place, for about 5 minutes. Then press the power button to start a reading. Do not talk or move while the reading is taking place.  Record your readings on a sheet of paper. Although most cuffs have a memory, it is often easier to see a pattern developing when the numbers are all in front of you.  You can repeat the reading after 1-3 minutes if it is recommended.   Make sure your bladder is empty and you have not had caffeine or tobacco within the last 30 minutes   Always bring your blood pressure log with you to your appointments. If you have not brought your monitor in to be double checked for accuracy, please bring it to your next appointment.   You can find a list of validated (accurate) blood pressure cuffs at: validatebp.org

## 2023-10-11 ENCOUNTER — Ambulatory Visit: Payer: BC Managed Care – PPO | Attending: Internal Medicine | Admitting: Pharmacist

## 2023-10-11 VITALS — BP 124/70 | Ht 66.0 in | Wt 248.4 lb

## 2023-10-11 DIAGNOSIS — I10 Essential (primary) hypertension: Secondary | ICD-10-CM | POA: Diagnosis not present

## 2023-10-11 MED ORDER — WEGOVY 1.7 MG/0.75ML ~~LOC~~ SOAJ: 1.7000 mg | SUBCUTANEOUS | 0 refills | Status: DC

## 2023-10-11 MED ORDER — WEGOVY 2.4 MG/0.75ML ~~LOC~~ SOAJ: 2.4000 mg | SUBCUTANEOUS | 6 refills | Status: DC

## 2023-10-11 NOTE — Progress Notes (Signed)
Patient ID: Ashley Bailey                 DOB: 1971/03/28                    MRN: [id number]  .  HPI: Ashley Bailey is a 53 y.o. female patient referred to pharmacy clinic by Dr.Tobb to initiate GLP1-RA therapy. PMH is significant for CAD, GERD, Smoking,HLD,HTN and obesity. Most recent BMI 41.82.  Patient seen 07/29/23 to initiate [facility]. BP at that visit was 135/80. She reported BP at her PCP office 120/75-78. She was asked to monitor BP at home and follow up in 4 weeks. She was encouraged to start exercising.  Patient presented today for follow up reports she does not check her BP at home. She bought new [employer] BP cuff but forget to check her BP. Last couple visit at doctor her BP ~128/70 range. She has lost lot of weight when she started [facility] but now her weight staying stable. She tolerates Wegovy 1 mg dose well will be taking her 4th dose this weekend and ready to go on the next dose. She exercises regularly but her diet needs improvement  We talks various food options and significance of following healthy diet.   Baseline weight and BMI: 259lbs 41.82 Current weight and BMI: 250 lbs 40.35 Goal weight 170 lbs  Current meds that affect weight: none   Current HTN meds: losartan 25 mg daily  Previously tried: none  BP goal: <130/80   Family History:  Relation Problem Comments  Mother Hypertension     Sister Diabetes     Social History:  Alcohol: 2-3 drinks per week  Smoking: 1/2 pack per day    Diet: eats only one meal per day and that is not necessarily healthy   Exercise: walks up and down the stairs, aerobics, dance videos 4-5 times a week for 10-15 min (sometimes repeats)   Home BP readings: does not check BP at home     Labs: Lab Results  Component Value Date   HGBA1C 5.4 12/04/2022    Wt Readings from Last 1 Encounters:  09/03/23 250 lb (113.4 kg)    BP Readings from Last 1 Encounters:  09/03/23 120/80   Pulse Readings from Last 1 Encounters:   09/03/23 75       Component Value Date/Time   CHOL 191 05/14/2013 1018    Past Medical History:  Diagnosis Date   Anxiety    Arthritis    wrist, knee   Edema    Hypertension    IBS (irritable bowel syndrome)    Seasonal allergies     Current Outpatient Medications on File Prior to Visit  Medication Sig Dispense Refill   Ascorbic Acid (SUPER C COMPLEX PO) Take 1 tablet by mouth daily.     Blood Pressure Monitor MISC Use as directed. 1 each 0   clonazePAM (KLONOPIN) 1 MG tablet Take 1 mg by mouth 2 (two) times daily as needed for anxiety.     fexofenadine (ALLEGRA) 180 MG tablet TAKE 1 TABLET BY MOUTH EVERY DAY 30 tablet 1   furosemide (LASIX) 20 MG tablet Take 20 mg by mouth daily.     losartan (COZAAR) 25 MG tablet Take 25 mg by mouth daily.     pantoprazole (PROTONIX) 40 MG tablet Take 1 tablet (40 mg total) by mouth daily. 90 tablet 3   potassium chloride (KLOR-CON) 10 MEQ tablet Take 10 mEq by mouth daily.  rosuvastatin (CRESTOR) 5 MG tablet Take 1 tablet (5 mg total) by mouth daily. 90 tablet 3   Semaglutide-Weight Management (WEGOVY) 1 MG/0.5ML SOAJ Inject 1 mg into the skin once a week. 2 mL 0   Vitamin A 2400 MCG (8000 UT) CAPS Take by mouth daily at 6 (six) AM.     No current facility-administered medications on file prior to visit.    Allergies  Allergen Reactions   Bee Pollen     Other Reaction(s): Other   Blue Dyes (Parenteral)    Pollen Extract    Powder Scent Fragrance      Assessment/Plan:  1. Weight loss -Patient has lost about 10 pounds since starting Wegovy.  Continue Wegovy 1 mg weekly.  Continue working on exercise and diet.  Encouraged her to cut out sugary drinks. Prescription for Wegovy 1.7 mg and 2.4 mg with 6 refills sent to the pharmacy    2. Hypertension  Assessment: In office BP 124/80 mmHg  (goal<130/80) Does not check BP at home bought new cuff  Last couple doctors visits - BP ~125-128/70 range  Watches salt intake and  exercising regularly  Plan:  Continue taking losartan 25 mg by mouth daily  Patient to contact us if BP start remaining above the goal  Follow up lab : none   Thank you,  Carmela Hurt, Pharm.D [facility] of [facility] [address] [place], [place] [address]  Phone: [phone]; Fax: [phone]

## 2023-10-17 DIAGNOSIS — Z124 Encounter for screening for malignant neoplasm of cervix: Secondary | ICD-10-CM | POA: Diagnosis not present

## 2023-10-17 DIAGNOSIS — Z01411 Encounter for gynecological examination (general) (routine) with abnormal findings: Secondary | ICD-10-CM | POA: Diagnosis not present

## 2023-10-17 DIAGNOSIS — N951 Menopausal and female climacteric states: Secondary | ICD-10-CM | POA: Diagnosis not present

## 2023-10-17 DIAGNOSIS — Z79890 Hormone replacement therapy (postmenopausal): Secondary | ICD-10-CM | POA: Diagnosis not present

## 2023-10-17 DIAGNOSIS — Z1231 Encounter for screening mammogram for malignant neoplasm of breast: Secondary | ICD-10-CM | POA: Diagnosis not present

## 2023-10-17 DIAGNOSIS — Z202 Contact with and (suspected) exposure to infections with a predominantly sexual mode of transmission: Secondary | ICD-10-CM | POA: Diagnosis not present

## 2023-10-17 DIAGNOSIS — Z113 Encounter for screening for infections with a predominantly sexual mode of transmission: Secondary | ICD-10-CM | POA: Diagnosis not present

## 2023-11-09 DIAGNOSIS — M545 Low back pain, unspecified: Secondary | ICD-10-CM | POA: Diagnosis not present

## 2023-11-10 ENCOUNTER — Other Ambulatory Visit: Payer: Self-pay | Admitting: Cardiology

## 2023-12-15 ENCOUNTER — Other Ambulatory Visit: Payer: Self-pay | Admitting: Cardiology

## 2023-12-18 ENCOUNTER — Telehealth: Payer: Self-pay | Admitting: Pharmacy Technician

## 2023-12-18 ENCOUNTER — Other Ambulatory Visit (HOSPITAL_COMMUNITY): Payer: Self-pay

## 2023-12-18 ENCOUNTER — Encounter: Payer: Self-pay | Admitting: Pharmacist

## 2023-12-18 ENCOUNTER — Telehealth: Payer: Self-pay | Admitting: Cardiology

## 2023-12-18 NOTE — Telephone Encounter (Signed)
 Pharmacy Patient Advocate Encounter   Received notification from Pt Calls Messages that prior authorization for WEGOVY  is required/requested.   Insurance verification completed.   The patient is insured through [facility] .   Per test claim: PA required; PA submitted to above mentioned insurance via CoverMyMeds Key/confirmation #/EOC [id number] Status is pending

## 2023-12-18 NOTE — Telephone Encounter (Signed)
 Patient weight is 237lb. See documentation note from today- please try submitting this

## 2023-12-18 NOTE — Progress Notes (Unsigned)
 Follow up for Wegovy  management. Patient current weight is 237lb. Starting weight 260lb. This is a 8.8% weight loss.

## 2023-12-18 NOTE — Telephone Encounter (Signed)
 Pt c/o medication issue:  1. Name of Medication:   Semaglutide -Weight Management (WEGOVY ) 2.4 MG/0.75ML SOAJ    2. How are you currently taking this medication (dosage and times per day)?   3. Are you having a reaction (difficulty breathing--STAT)? No  4. What is your medication issue? Pt states that per pharmacy her insurance is needing for medication to be approved for however many months that she is to take it. Pt states that she is due to take medication today but is not able to get due to this issue. Please advise

## 2023-12-18 NOTE — Telephone Encounter (Signed)
 Hi, this was denied stating:    I called [phone] and they said she had to have lost 5% and the last weight we have is from 10/11/23 Wt 248 lb 6.4 oz, beginning weight was 04/15/23 Wt 259 lb (117.5 kg). They said it would have to be chart visit documented what her current weight is to see if it meets the 5% requirement. They said when we got the new weight, I can appeal it or try again with another prior auth with the new weight.

## 2023-12-19 ENCOUNTER — Other Ambulatory Visit (HOSPITAL_COMMUNITY): Payer: Self-pay

## 2023-12-19 ENCOUNTER — Telehealth: Payer: Self-pay | Admitting: Pharmacy Technician

## 2023-12-19 ENCOUNTER — Encounter: Payer: Self-pay | Admitting: Pharmacy Technician

## 2023-12-19 NOTE — Telephone Encounter (Signed)
 Pharmacy Patient Advocate Encounter   Received notification from Pt Calls Messages that prior authorization for wegovy  is required/requested.   Insurance verification completed.   The patient is insured through [facility] .   Per test claim: PA required; PA submitted to above mentioned insurance via CoverMyMeds Key/confirmation #/EOC [id number] Status is pending   Sent with new weight

## 2023-12-19 NOTE — Telephone Encounter (Signed)
 Pharmacy Patient Advocate Encounter  Received notification from [facility] that Prior Authorization for wegovy  has been APPROVED from 12/19/23 to 12/18/24. Spoke to pharmacy to process.Copay is $0.00.

## 2024-01-20 DIAGNOSIS — Z79890 Hormone replacement therapy (postmenopausal): Secondary | ICD-10-CM | POA: Diagnosis not present

## 2024-05-07 ENCOUNTER — Other Ambulatory Visit: Payer: Self-pay | Admitting: Cardiology

## 2024-07-01 ENCOUNTER — Other Ambulatory Visit: Payer: Self-pay | Admitting: Cardiology

## 2024-07-03 ENCOUNTER — Telehealth: Payer: Self-pay | Admitting: Pharmacist

## 2024-07-03 MED ORDER — WEGOVY 2.4 MG/0.75ML ~~LOC~~ SOAJ: 2.4000 mg | SUBCUTANEOUS | 0 refills | Status: AC

## 2024-07-03 NOTE — Telephone Encounter (Signed)
 Patient is aware she is overdue for apt. Advised to call for apt. She will change insurances in Dec. Not sure if there will be a lapse. I will send 90 day to see if they can fill. She is on 2.4mg  of Wegovy . [facility] other strengths.

## 2024-07-10 NOTE — Telephone Encounter (Signed)
 Pt called and stated she missed her dose of Wegovy  Tues.  It is now Friday and she would like to know if it ok to take it today   Best 336 [phone]

## 2024-07-14 NOTE — Telephone Encounter (Signed)
 Patient took her dose on last Saturday. Aware now on her weekly dose will be due on every Saturday. In process of switching jobs and insurance wants to make sure last prescription has 3 months worth of refills. The prescription that we sent on Nov 17 was for 3 months patient made aware.
